# Patient Record
Sex: Female | Born: 1990 | Hispanic: No | Marital: Married | Smoking: Never smoker
Health system: Southern US, Community
[De-identification: ages and names within clinical notes are randomized; demographics above are authoritative.]

## PROBLEM LIST (undated history)

## (undated) DIAGNOSIS — Z789 Other specified health status: Secondary | ICD-10-CM

## (undated) DIAGNOSIS — D649 Anemia, unspecified: Secondary | ICD-10-CM

## (undated) HISTORY — DX: Anemia, unspecified: D64.9

## (undated) HISTORY — PX: NO PAST SURGERIES: SHX2092

---

## 2018-11-26 ENCOUNTER — Encounter: Payer: Self-pay | Admitting: Family Medicine

## 2018-11-26 ENCOUNTER — Ambulatory Visit (INDEPENDENT_AMBULATORY_CARE_PROVIDER_SITE_OTHER): Payer: 59 | Admitting: Family Medicine

## 2018-11-26 ENCOUNTER — Other Ambulatory Visit: Payer: Self-pay

## 2018-11-26 DIAGNOSIS — Z113 Encounter for screening for infections with a predominantly sexual mode of transmission: Secondary | ICD-10-CM | POA: Diagnosis not present

## 2018-11-26 DIAGNOSIS — Z23 Encounter for immunization: Secondary | ICD-10-CM

## 2018-11-26 DIAGNOSIS — Z3401 Encounter for supervision of normal first pregnancy, first trimester: Secondary | ICD-10-CM

## 2018-11-26 DIAGNOSIS — Z3400 Encounter for supervision of normal first pregnancy, unspecified trimester: Secondary | ICD-10-CM

## 2018-11-26 DIAGNOSIS — Z3A01 Less than 8 weeks gestation of pregnancy: Secondary | ICD-10-CM

## 2018-11-26 DIAGNOSIS — Z3689 Encounter for other specified antenatal screening: Secondary | ICD-10-CM | POA: Diagnosis not present

## 2018-11-26 NOTE — Progress Notes (Signed)
  Subjective:  Crystal Bradford is a G1P0 [redacted]w[redacted]d being seen today for her first obstetrical visit.  Her obstetrical history is significant for first pregnancy. She and her husband are excited about the pregnancy. Patient does intend to breast feed. Pregnancy history fully reviewed.  Patient reports nausea and vomiting.  BP 136/83   Pulse (!) 106   Ht 5\' 5"  (1.651 m)   Wt 210 lb 1.9 oz (95.3 kg)   LMP 09/18/2018 (Exact Date)   BMI 34.97 kg/m   HISTORY: OB History  Gravida Para Term Preterm AB Living  1            SAB TAB Ectopic Multiple Live Births               # Outcome Date GA Lbr Len/2nd Weight Sex Delivery Anes PTL Lv  1 Current             History reviewed. No pertinent past medical history.  History reviewed. No pertinent surgical history.  Family History  Problem Relation Age of Onset  . Hypertension Mother   . Diabetes Mother   . Arthritis Mother      Exam  BP 136/83   Pulse (!) 106   Ht 5\' 5"  (1.651 m)   Wt 210 lb 1.9 oz (95.3 kg)   LMP 09/18/2018 (Exact Date)   BMI 34.97 kg/m   CONSTITUTIONAL: Well-developed, well-nourished female in no acute distress.  HENT:  Normocephalic, atraumatic, External right and left ear normal. Oropharynx is clear and moist EYES: Conjunctivae and EOM are normal. Pupils are equal, round, and reactive to light. No scleral icterus.  NECK: Normal range of motion, supple, no masses.  Normal thyroid.  CARDIOVASCULAR: Normal heart rate noted, regular rhythm RESPIRATORY: Clear to auscultation bilaterally. Effort and breath sounds normal, no problems with respiration noted. BREASTS: Symmetric in size. No masses, skin changes, nipple drainage, or lymphadenopathy. ABDOMEN: Soft, normal bowel sounds, no distention noted.  No tenderness, rebound or guarding.  PELVIC: Normal appearing external genitalia; normal appearing vaginal mucosa and cervix. No abnormal discharge noted. Normal uterine size, no other palpable masses, no uterine or adnexal  tenderness. MUSCULOSKELETAL: Normal range of motion. No tenderness.  No cyanosis, clubbing, or edema.  2+ distal pulses. SKIN: Skin is warm and dry. No rash noted. Not diaphoretic. No erythema. No pallor. NEUROLOGIC: Alert and oriented to person, place, and time. Normal reflexes, muscle tone coordination. No cranial nerve deficit noted. PSYCHIATRIC: Normal mood and affect. Normal behavior. Normal judgment and thought content.    Assessment:    Pregnancy: G1P0 Patient Active Problem List   Diagnosis Date Noted  . Supervision of normal first pregnancy, antepartum 11/26/2018      Plan:   1. Supervision of normal first pregnancy, antepartum FHT normal. Discussed panorama - would like at next appt. - Hemoglobinopathy Evaluation - Obstetric Panel, Including HIV - Culture, OB Urine - Baby[employer] APP only - SMN1 COPY NUMBER ANALYSIS (SMA Carrier Screen) - GC/Chlamydia probe amp ([place])not at [facility] - HgB A1c - Flu Vaccine QUAD 36+ mos IM (Fluarix, Quad PF)     Problem list reviewed and updated. 75% of 30 min visit spent on counseling and coordination of care.     Crystal Bradford 11/26/2018

## 2018-11-26 NOTE — Progress Notes (Signed)
DATING AND VIABILITY SONOGRAM   Crystal Bradford is a 29 y.o. year old G1P0 with LMP Patient's last menstrual period was 09/18/2018 (exact date). which would correlate to  [redacted]w[redacted]d weeks gestation.  She has regular menstrual cycles.   She is here today for a confirmatory initial sonogram.    GESTATION: SINGLETON     FETAL ACTIVITY:          Heart rate        164          The fetus is active.    GESTATIONAL AGE AND  BIOMETRICS:  Gestational criteria: Estimated Date of Delivery: 06/25/19 by LMP now at [redacted]w[redacted]d  Previous Scans:0  GESTATIONAL SAC           4.20 cm       9-4 weeks  CROWN RUMP LENGTH           2.79 cm         9-4 weeks                                                                               AVERAGE EGA(BY THIS SCAN):  9-4 weeks  WORKING EDD( LMP ):  06/25/19     TECHNICIAN COMMENTS: Patient informed that the ultrasound is considered a limited obstetric ultrasound and is not intended to be a complete ultrasound exam. Patient also informed that the ultrasound is not being completed with the intent of assessing for fetal or placental anomalies or any pelvic abnormalities. Explained that the purpose of today's ultrasound is to assess for fetal heart rate. Patient acknowledges the purpose of the exam and the limitations of the study.    Kathrene Alu 11/26/2018 8:49 AM

## 2018-11-27 LAB — GC/CHLAMYDIA PROBE AMP (~~LOC~~) NOT AT ARMC
Chlamydia: NEGATIVE
Comment: NEGATIVE
Comment: NORMAL
Neisseria Gonorrhea: NEGATIVE

## 2018-11-28 LAB — CULTURE, OB URINE

## 2018-11-28 LAB — URINE CULTURE, OB REFLEX

## 2018-12-07 LAB — HEMOGLOBINOPATHY EVALUATION
Ferritin: 80 ng/mL (ref 15–150)
Hgb A2 Quant: 2 % (ref 1.8–3.2)
Hgb A: 98 % (ref 96.4–98.8)
Hgb C: 0 %
Hgb F Quant: 0 % (ref 0.0–2.0)
Hgb S: 0 %
Hgb Solubility: NEGATIVE
Hgb Variant: 0 %

## 2018-12-07 LAB — SMN1 COPY NUMBER ANALYSIS (SMA CARRIER SCREENING)

## 2018-12-07 LAB — OBSTETRIC PANEL, INCLUDING HIV
Antibody Screen: NEGATIVE
Basophils Absolute: 0 10*3/uL (ref 0.0–0.2)
Basos: 0 %
EOS (ABSOLUTE): 0.1 10*3/uL (ref 0.0–0.4)
Eos: 1 %
HIV Screen 4th Generation wRfx: NONREACTIVE
Hematocrit: 39.7 % (ref 34.0–46.6)
Hemoglobin: 13.1 g/dL (ref 11.1–15.9)
Hepatitis B Surface Ag: NEGATIVE
Immature Grans (Abs): 0 10*3/uL (ref 0.0–0.1)
Immature Granulocytes: 0 %
Lymphocytes Absolute: 1.9 10*3/uL (ref 0.7–3.1)
Lymphs: 24 %
MCH: 27.7 pg (ref 26.6–33.0)
MCHC: 33 g/dL (ref 31.5–35.7)
MCV: 84 fL (ref 79–97)
Monocytes Absolute: 0.5 10*3/uL (ref 0.1–0.9)
Monocytes: 6 %
Neutrophils Absolute: 5.6 10*3/uL (ref 1.4–7.0)
Neutrophils: 69 %
Platelets: 313 10*3/uL (ref 150–450)
RBC: 4.73 x10E6/uL (ref 3.77–5.28)
RDW: 14.2 % (ref 11.7–15.4)
RPR Ser Ql: NONREACTIVE
Rh Factor: POSITIVE
Rubella Antibodies, IGG: 5.36 index (ref >0.99)
WBC: 8.1 10*3/uL (ref 3.4–10.8)

## 2018-12-07 LAB — HEMOGLOBIN A1C
Est. average glucose Bld gHb Est-mCnc: 103 mg/dL
Hgb A1c MFr Bld: 5.2 % (ref 4.8–5.6)

## 2018-12-25 ENCOUNTER — Other Ambulatory Visit: Payer: Self-pay

## 2018-12-25 ENCOUNTER — Ambulatory Visit (INDEPENDENT_AMBULATORY_CARE_PROVIDER_SITE_OTHER): Payer: 59 | Admitting: Obstetrics & Gynecology

## 2018-12-25 VITALS — BP 123/72 | HR 99 | Wt 211.0 lb

## 2018-12-25 DIAGNOSIS — Z3402 Encounter for supervision of normal first pregnancy, second trimester: Secondary | ICD-10-CM

## 2018-12-25 DIAGNOSIS — Z3400 Encounter for supervision of normal first pregnancy, unspecified trimester: Secondary | ICD-10-CM

## 2018-12-25 DIAGNOSIS — Z3A14 14 weeks gestation of pregnancy: Secondary | ICD-10-CM

## 2018-12-25 NOTE — Progress Notes (Signed)
   PRENATAL VISIT NOTE  Subjective:  Crystal Bradford is a 28 y.o. G1P0 at [redacted]w[redacted]d being seen today for ongoing prenatal care.  She is currently monitored for the following issues for this low-risk pregnancy and has Supervision of normal first pregnancy, antepartum on their problem list.  Patient reports no complaints.  Contractions: Not present. Vag. Bleeding: None.  Movement: Absent. Denies leaking of fluid.   The following portions of the patient's history were reviewed and updated as appropriate: allergies, current medications, past family history, past medical history, past social history, past surgical history and problem list.   Objective:   Vitals:   12/25/18 0809  BP: 123/72  Pulse: 99  Weight: 211 lb 0.6 oz (95.7 kg)    Fetal Status:     Movement: Absent     General:  Alert, oriented and cooperative. Patient is in no acute distress.  Skin: Skin is warm and dry. No rash noted.   Cardiovascular: Normal heart rate noted  Respiratory: Normal respiratory effort, no problems with respiration noted  Abdomen: Soft, gravid, appropriate for gestational age.  Pain/Pressure: Present     Pelvic: Cervical exam deferred        Extremities: Normal range of motion.  Edema: Trace  Mental Status: Normal mood and affect. Normal behavior. Normal judgment and thought content.   Assessment and Plan:  Pregnancy: G1P0 at [redacted]w[redacted]d 1. Supervision of normal first pregnancy, antepartum No FM to date as expected  N/V has resolved.   NIPS today F/u in 2 weeks for AFP Anatomy scan in 4-6 weeks   Preterm labor symptoms and general obstetric precautions including but not limited to vaginal bleeding, contractions, leaking of fluid and fetal movement were reviewed in detail with the patient. Please refer to After Visit Summary for other counseling recommendations.   Return in about 4 weeks (around 01/22/2019) for Web based.  No future appointments.  Lavonia Drafts, MD

## 2018-12-25 NOTE — Addendum Note (Signed)
Addended by: Phill Myron on: 12/25/2018 08:40 AM   Modules accepted: Orders

## 2018-12-25 NOTE — Patient Instructions (Signed)

## 2019-01-13 DIAGNOSIS — Z3400 Encounter for supervision of normal first pregnancy, unspecified trimester: Secondary | ICD-10-CM

## 2019-01-14 ENCOUNTER — Other Ambulatory Visit: Payer: Self-pay

## 2019-01-14 ENCOUNTER — Other Ambulatory Visit: Payer: 59

## 2019-01-14 DIAGNOSIS — Z3400 Encounter for supervision of normal first pregnancy, unspecified trimester: Secondary | ICD-10-CM

## 2019-01-14 NOTE — Progress Notes (Signed)
Patient sent to lab for AFP.  Patient given panorama results. Kathrene Alu RN

## 2019-01-15 NOTE — L&D Delivery Note (Addendum)
OB/GYN Faculty Practice Delivery Note  Crystal Bradford is a 29 y.o. G1P0 s/p SVD at [redacted]w[redacted]d. She was admitted for SOL/SROM.    ROM: 20h 39m with clear fluid GBS Status: Negative Maximum Maternal Temperature: 99.93F  Labor Progress:  Presented after SROM at home and cervical exam 4.5/60%/-2.  Received cytotec.  Pitocin was started at 0300 on 06/18/2019.  She received a epidural.  Remainder of labor unremarkable and progressed to complete.    Delivery Date/Time: 06/18/2019 at 1222 Delivery: Called to room and patient was complete and pushing. Head delivered LOA. No nuchal cord present. Shoulder and body delivered in usual fashion. Infant with spontaneous cry, placed on mother's abdomen, dried and stimulated. Cord clamped x 2 after 1-minute delay, and cut by FOB under my direct supervision. Cord blood drawn. Placenta delivered spontaneously with gentle cord traction. Fundus firm with massage and Pitocin. Labia, perineum, vagina, and cervix were inspected, found to have a hemostatic 1st degree perineal laceration.   Placenta: Intact, 3 vessel cord Complications: None Lacerations: Hemostatic 1st degree perineal EBL: 350cc Analgesia: None   Infant: Viable female   APGARs 8/9   per nursing documentation   EMILY Genene Churn, MD PGY-2 Resident, Family Medicine 06/18/2019, 12:53 PM  OB FELLOW DELIVERY ATTESTATION  I was gloved and present for the delivery in its entirety, and I agree with the above resident's note.    Jerilynn Birkenhead, MD [facility] Family Medicine Fellow, [facility] for [employer], [facility]

## 2019-01-17 LAB — AFP TETRA
DIA Mom Value: 1.65
DIA Value (EIA): 219.89 pg/mL
DSR (By Age)    1 IN: 842
DSR (Second Trimester) 1 IN: 543
Gestational Age: 16.6 WEEKS
MSAFP Mom: 0.67
MSAFP: 21.5 ng/mL
MSHCG Mom: 1.19
MSHCG: 34088 m[IU]/mL
Maternal Age At EDD: 28.2 yr
Osb Risk: 10000
T18 (By Age): 1:3281 {titer}
Test Results:: NEGATIVE
Weight: 211 [lb_av]
uE3 Mom: 1.14
uE3 Value: 1.14 ng/mL

## 2019-01-22 ENCOUNTER — Other Ambulatory Visit: Payer: Self-pay

## 2019-01-22 ENCOUNTER — Ambulatory Visit (INDEPENDENT_AMBULATORY_CARE_PROVIDER_SITE_OTHER): Payer: 59 | Admitting: Family Medicine

## 2019-01-22 VITALS — BP 120/61 | HR 108 | Wt 213.0 lb

## 2019-01-22 DIAGNOSIS — Z3402 Encounter for supervision of normal first pregnancy, second trimester: Secondary | ICD-10-CM

## 2019-01-22 DIAGNOSIS — Z3400 Encounter for supervision of normal first pregnancy, unspecified trimester: Secondary | ICD-10-CM

## 2019-01-22 DIAGNOSIS — Z3A18 18 weeks gestation of pregnancy: Secondary | ICD-10-CM

## 2019-01-22 NOTE — Progress Notes (Signed)
   PRENATAL VISIT NOTE  Subjective:  Crystal Bradford is a 29 y.o. G1P0 at [redacted]w[redacted]d being seen today for ongoing prenatal care.  She is currently monitored for the following issues for this low-risk pregnancy and has Supervision of normal first pregnancy, antepartum on their problem list.  Patient reports no complaints.  Contractions: Not present. Vag. Bleeding: None.  Movement: Present. Denies leaking of fluid.   The following portions of the patient's history were reviewed and updated as appropriate: allergies, current medications, past family history, past medical history, past social history, past surgical history and problem list.   Objective:   Vitals:   01/22/19 0923  BP: 120/61  Pulse: (!) 108  Weight: 213 lb (96.6 kg)    Fetal Status: Fetal Heart Rate (bpm): 159   Movement: Present     General:  Alert, oriented and cooperative. Patient is in no acute distress.  Skin: Skin is warm and dry. No rash noted.   Cardiovascular: Normal heart rate noted  Respiratory: Normal respiratory effort, no problems with respiration noted  Abdomen: Soft, gravid, appropriate for gestational age.  Pain/Pressure: Present     Pelvic: Cervical exam deferred        Extremities: Normal range of motion.  Edema: None  Mental Status: Normal mood and affect. Normal behavior. Normal judgment and thought content.   Assessment and Plan:  Pregnancy: G1P0 at [redacted]w[redacted]d  1. Supervision of normal first pregnancy, antepartum NIPS normal  Preterm labor symptoms and general obstetric precautions including but not limited to vaginal bleeding, contractions, leaking of fluid and fetal movement were reviewed in detail with the patient. Please refer to After Visit Summary for other counseling recommendations.   Return in about 4 weeks (around 02/19/2019) for OB f/u.  Future Appointments  Date Time Provider Department Center  01/29/2019  9:00 AM [facility] [place] 3 WH-[facility]US    Levie Heritage, DO

## 2019-01-29 ENCOUNTER — Other Ambulatory Visit: Payer: Self-pay

## 2019-01-29 ENCOUNTER — Ambulatory Visit (HOSPITAL_COMMUNITY)
Admission: RE | Admit: 2019-01-29 | Discharge: 2019-01-29 | Disposition: A | Discharge status: Home / Self Care | Payer: 59 | Source: Ambulatory Visit | Attending: Obstetrics and Gynecology | Admitting: Obstetrics and Gynecology

## 2019-01-29 ENCOUNTER — Other Ambulatory Visit (HOSPITAL_COMMUNITY): Payer: Self-pay | Admitting: *Deleted

## 2019-01-29 DIAGNOSIS — O99212 Obesity complicating pregnancy, second trimester: Secondary | ICD-10-CM

## 2019-01-29 DIAGNOSIS — Z3400 Encounter for supervision of normal first pregnancy, unspecified trimester: Secondary | ICD-10-CM

## 2019-01-29 DIAGNOSIS — Z3A19 19 weeks gestation of pregnancy: Secondary | ICD-10-CM

## 2019-01-29 DIAGNOSIS — Z362 Encounter for other antenatal screening follow-up: Secondary | ICD-10-CM

## 2019-02-02 ENCOUNTER — Telehealth: Payer: Self-pay

## 2019-02-02 NOTE — Telephone Encounter (Signed)
Pt called the office concerned about [place] results. I explained to pt that a f/u US has been scheduled because the [place] tech was unable to see everything because of the position that baby was in. Pt also requests Rx for nausea medication. Advised pt to try B6 100 mg 3x daily and Unisom once daily. Understanding was voiced.  Tasman Zapata l [place], CMA

## 2019-02-18 ENCOUNTER — Encounter: Payer: Self-pay | Admitting: Family Medicine

## 2019-02-18 ENCOUNTER — Other Ambulatory Visit: Payer: Self-pay

## 2019-02-18 ENCOUNTER — Ambulatory Visit (INDEPENDENT_AMBULATORY_CARE_PROVIDER_SITE_OTHER): Payer: 59 | Admitting: Family Medicine

## 2019-02-18 VITALS — BP 121/63 | HR 115 | Wt 218.0 lb

## 2019-02-18 DIAGNOSIS — Z3400 Encounter for supervision of normal first pregnancy, unspecified trimester: Secondary | ICD-10-CM

## 2019-02-18 DIAGNOSIS — Z3A21 21 weeks gestation of pregnancy: Secondary | ICD-10-CM

## 2019-02-18 DIAGNOSIS — Z3402 Encounter for supervision of normal first pregnancy, second trimester: Secondary | ICD-10-CM

## 2019-02-18 NOTE — Progress Notes (Signed)
   PRENATAL VISIT NOTE  Subjective:  Crystal Bradford is a 29 y.o. G1P0 at [redacted]w[redacted]d being seen today for ongoing prenatal care.  She is currently monitored for the following issues for this low-risk pregnancy and has Supervision of normal first pregnancy, antepartum on their problem list.  Patient reports some lower abd pain after walking.  Contractions: Not present. Vag. Bleeding: None.  Movement: Present. Denies leaking of fluid.   The following portions of the patient's history were reviewed and updated as appropriate: allergies, current medications, past family history, past medical history, past social history, past surgical history and problem list.   Objective:   Vitals:   02/18/19 0927  BP: 121/63  Pulse: (!) 115  Weight: 218 lb (98.9 kg)    Fetal Status: Fetal Heart Rate (bpm): 164 Fundal Height: 20 cm Movement: Present     General:  Alert, oriented and cooperative. Patient is in no acute distress.  Skin: Skin is warm and dry. No rash noted.   Cardiovascular: Normal heart rate noted  Respiratory: Normal respiratory effort, no problems with respiration noted  Abdomen: Soft, gravid, appropriate for gestational age.  Pain/Pressure: Present     Pelvic: Cervical exam deferred        Extremities: Normal range of motion.  Edema: None  Mental Status: Normal mood and affect. Normal behavior. Normal judgment and thought content.   Assessment and Plan:  Pregnancy: G1P0 at [redacted]w[redacted]d 1. Supervision of normal first pregnancy, antepartum FHT and FH normal. [place] reviewed. Has f/u US in a couple of weeks.   Preterm labor symptoms and general obstetric precautions including but not limited to vaginal bleeding, contractions, leaking of fluid and fetal movement were reviewed in detail with the patient. Please refer to After Visit Summary for other counseling recommendations.   Return in about 4 weeks (around 03/18/2019) for HR OB f/u, 2 hr GTT, In Office.  Future Appointments  Date Time Provider  Department Center  02/26/2019  8:30 AM [facility] [place] 5 WH-MFCUS MFC-US  03/18/2019  8:30 AM Levie Heritage, DO [facility] None    Levie Heritage, DO

## 2019-02-26 ENCOUNTER — Other Ambulatory Visit: Payer: Self-pay

## 2019-02-26 ENCOUNTER — Ambulatory Visit (HOSPITAL_COMMUNITY)
Admission: RE | Admit: 2019-02-26 | Discharge: 2019-02-26 | Disposition: A | Discharge status: Home / Self Care | Payer: 59 | Source: Ambulatory Visit | Attending: Obstetrics and Gynecology | Admitting: Obstetrics and Gynecology

## 2019-02-26 DIAGNOSIS — O99212 Obesity complicating pregnancy, second trimester: Secondary | ICD-10-CM | POA: Diagnosis not present

## 2019-02-26 DIAGNOSIS — Z3A23 23 weeks gestation of pregnancy: Secondary | ICD-10-CM

## 2019-02-26 DIAGNOSIS — Z362 Encounter for other antenatal screening follow-up: Secondary | ICD-10-CM

## 2019-03-12 ENCOUNTER — Encounter (HOSPITAL_COMMUNITY): Payer: Self-pay | Admitting: Obstetrics and Gynecology

## 2019-03-12 ENCOUNTER — Inpatient Hospital Stay (HOSPITAL_COMMUNITY)
Admission: AD | Admit: 2019-03-12 | Discharge: 2019-03-12 | Disposition: A | Discharge status: Home / Self Care | Payer: 59 | Attending: Obstetrics and Gynecology | Admitting: Obstetrics and Gynecology

## 2019-03-12 ENCOUNTER — Other Ambulatory Visit: Payer: Self-pay

## 2019-03-12 DIAGNOSIS — O368120 Decreased fetal movements, second trimester, not applicable or unspecified: Secondary | ICD-10-CM | POA: Insufficient documentation

## 2019-03-12 DIAGNOSIS — Z880 Allergy status to penicillin: Secondary | ICD-10-CM | POA: Diagnosis not present

## 2019-03-12 DIAGNOSIS — R030 Elevated blood-pressure reading, without diagnosis of hypertension: Secondary | ICD-10-CM | POA: Diagnosis not present

## 2019-03-12 DIAGNOSIS — Z3400 Encounter for supervision of normal first pregnancy, unspecified trimester: Secondary | ICD-10-CM

## 2019-03-12 DIAGNOSIS — O26892 Other specified pregnancy related conditions, second trimester: Secondary | ICD-10-CM | POA: Insufficient documentation

## 2019-03-12 DIAGNOSIS — Z3492 Encounter for supervision of normal pregnancy, unspecified, second trimester: Secondary | ICD-10-CM

## 2019-03-12 DIAGNOSIS — O368121 Decreased fetal movements, second trimester, fetus 1: Secondary | ICD-10-CM

## 2019-03-12 DIAGNOSIS — Z3A25 25 weeks gestation of pregnancy: Secondary | ICD-10-CM

## 2019-03-12 HISTORY — DX: Other specified health status: Z78.9

## 2019-03-12 NOTE — MAU Note (Signed)
Hasn't felt the baby ,move since like 4 this morning.  Usually feels it around 1000 and again around 1400, hasn't felt it and that is just not normal. Denies pain.

## 2019-03-12 NOTE — MAU Provider Note (Addendum)
History     CSN: [id number]  Arrival date and time: 03/12/19 1625   First Provider Initiated Contact with Patient 03/12/19 1709      Chief Complaint  Patient presents with  . Decreased Fetal Movement   HPI Crystal Bradford is a 29 y.o. G1P0 at [redacted]w[redacted]d who presents to MAU with chief complaint of decreased fetal movement. This is a new problem, onset today at 0400. Patient states she responded to the decreased fetal movement by eating, drinking a sugary drink and moving around but was unable to facilitate fetal movement. She became even more concern when she was unable to detect fetal heart tones with her home Doppler.   She denies pain, vaginal bleeding, leaking of fluid, fever, falls, or recent illness.   OB History    Gravida  1   Para      Term      Preterm      AB      Living        SAB      TAB      Ectopic      Multiple      Live Births              Past Medical History:  Diagnosis Date  . Medical history non-contributory     Past Surgical History:  Procedure Laterality Date  . NO PAST SURGERIES      Family History  Problem Relation Age of Onset  . Hypertension Mother   . Diabetes Mother   . Arthritis Mother     Social History   Tobacco Use  . Smoking status: Never Smoker  . Smokeless tobacco: Never Used  Substance Use Topics  . Alcohol use: Never  . Drug use: Never    Allergies:  Allergies  Allergen Reactions  . Penicillin G Rash  . Amoxicillin Hives and Swelling    No medications prior to admission.    Review of Systems  Constitutional: Negative for chills, fatigue and fever.  Eyes: Negative for photophobia and visual disturbance.  Gastrointestinal: Negative for abdominal pain.  Genitourinary: Negative for vaginal bleeding, vaginal discharge and vaginal pain.  Musculoskeletal: Negative for back pain.  Neurological: Negative for headaches.  All other systems reviewed and are negative.  Physical Exam   Blood pressure  135/72, pulse (!) 101, temperature 99.6 F (37.6 C), temperature source Oral, resp. rate 18, weight 105.7 kg, last menstrual period 09/18/2018, SpO2 100 %.  Physical Exam  Nursing note and vitals reviewed. Constitutional: She is oriented to person, place, and time. She appears well-developed and well-nourished.  Cardiovascular: Normal rate.  Respiratory: Effort normal and breath sounds normal.  GI: She exhibits no distension. There is no abdominal tenderness. There is no rebound, no guarding and no CVA tenderness.  Gravid  Neurological: She is alert and oriented to person, place, and time.  Skin: Skin is warm and dry.  Psychiatric: She has a normal mood and affect. Her behavior is normal. Judgment and thought content normal.    MAU Course/MDM  Procedures  --Reassuring fetal surveillance, elements of tracing reviewed with patient. Baseline 140, mod var, 15 x 15 accels, no decels --Patient given NST button. Pushed button 14 times within twelve minutes --Elevated initial blood pressure when anxious about fetal status, then normotensive once reassurance provided.    No severe range or severe symptoms --Reviewed typical initiation of kick counts at 28-30 weeks.   Patient Vitals for the past 24 hrs:  BP Temp Temp  src Pulse Resp SpO2 Weight  03/12/19 1747 135/72 -- -- -- -- -- --  03/12/19 1730 123/63 -- -- (!) 101 -- -- --  03/12/19 1705 132/77 -- -- (!) 116 -- -- --  03/12/19 1639 (!) 142/66 99.6 F (37.6 C) Oral (!) 122 18 100 % 105.7 kg    Assessment and Plan  --29 y.o. G1P0 at [redacted]w[redacted]d  --Reactive tracing --Active fetal movement detectable by patient --Elevated BP x 1 --Discharge home in stable condition  F/U: --[facility] 03/18/2019  Calvert Cantor, CNM 03/12/2019, 6:35 PM

## 2019-03-12 NOTE — Discharge Instructions (Signed)
Fetal Movement Counts Patient Name: ________________________________________________ Patient Due Date: ____________________ What is a fetal movement count?  A fetal movement count is the number of times that you feel your baby move during a certain amount of time. This may also be called a fetal kick count. A fetal movement count is recommended for every pregnant woman. You may be asked to start counting fetal movements as early as week 28 of your pregnancy. Pay attention to when your baby is most active. You may notice your baby's sleep and wake cycles. You may also notice things that make your baby move more. You should do a fetal movement count:  When your baby is normally most active.  At the same time each day. A good time to count movements is while you are resting, after having something to eat and drink. How do I count fetal movements? 1. Find a quiet, comfortable area. Sit, or lie down on your side. 2. Write down the date, the start time and stop time, and the number of movements that you felt between those two times. Take this information with you to your health care visits. 3. Write down your start time when you feel the first movement. 4. Count kicks, flutters, swishes, rolls, and jabs. You should feel at least 10 movements. 5. You may stop counting after you have felt 10 movements, or if you have been counting for 2 hours. Write down the stop time. 6. If you do not feel 10 movements in 2 hours, contact your health care provider for further instructions. Your health care provider may want to do additional tests to assess your baby's well-being. Contact a health care provider if:  You feel fewer than 10 movements in 2 hours.  Your baby is not moving like he or she usually does. Date: ____________ Start time: ____________ Stop time: ____________ Movements: ____________ Date: ____________ Start time: ____________ Stop time: ____________ Movements: ____________ Date: ____________  Start time: ____________ Stop time: ____________ Movements: ____________ Date: ____________ Start time: ____________ Stop time: ____________ Movements: ____________ Date: ____________ Start time: ____________ Stop time: ____________ Movements: ____________ Date: ____________ Start time: ____________ Stop time: ____________ Movements: ____________ Date: ____________ Start time: ____________ Stop time: ____________ Movements: ____________ Date: ____________ Start time: ____________ Stop time: ____________ Movements: ____________ Date: ____________ Start time: ____________ Stop time: ____________ Movements: ____________ This information is not intended to replace advice given to you by your health care provider. Make sure you discuss any questions you have with your health care provider. Document Revised: 08/20/2018 Document Reviewed: 08/20/2018 Elsevier Patient Education  2020 [employer].  

## 2019-03-18 ENCOUNTER — Other Ambulatory Visit: Payer: Self-pay

## 2019-03-18 ENCOUNTER — Ambulatory Visit (INDEPENDENT_AMBULATORY_CARE_PROVIDER_SITE_OTHER): Payer: 59 | Admitting: Family Medicine

## 2019-03-18 ENCOUNTER — Encounter: Payer: Self-pay | Admitting: Family Medicine

## 2019-03-18 VITALS — BP 130/61 | HR 95 | Wt 222.0 lb

## 2019-03-18 DIAGNOSIS — Z23 Encounter for immunization: Secondary | ICD-10-CM

## 2019-03-18 DIAGNOSIS — Z3400 Encounter for supervision of normal first pregnancy, unspecified trimester: Secondary | ICD-10-CM

## 2019-03-18 NOTE — Progress Notes (Signed)
Patient complaining of constipation. Armandina Stammer RN

## 2019-03-18 NOTE — Progress Notes (Signed)
   PRENATAL VISIT NOTE  Subjective:  Crystal Bradford is a 29 y.o. G1P0 at [redacted]w[redacted]d being seen today for ongoing prenatal care.  She is currently monitored for the following issues for this low-risk pregnancy and has Supervision of normal first pregnancy, antepartum on their problem list.  Patient reports mild hemorrhoids, constipation.  Contractions: Not present. Vag. Bleeding: None.  Movement: Present. Denies leaking of fluid.   The following portions of the patient's history were reviewed and updated as appropriate: allergies, current medications, past family history, past medical history, past social history, past surgical history and problem list.   Objective:   Vitals:   03/18/19 0826  BP: 130/61  Pulse: 95  Weight: 222 lb (100.7 kg)    Fetal Status: Fetal Heart Rate (bpm): 145   Movement: Present     General:  Alert, oriented and cooperative. Patient is in no acute distress.  Skin: Skin is warm and dry. No rash noted.   Cardiovascular: Normal heart rate noted  Respiratory: Normal respiratory effort, no problems with respiration noted  Abdomen: Soft, gravid, appropriate for gestational age.  Pain/Pressure: Present     Pelvic: Cervical exam deferred        Extremities: Normal range of motion.  Edema: None  Mental Status: Normal mood and affect. Normal behavior. Normal judgment and thought content.   Assessment and Plan:  Pregnancy: G1P0 at [redacted]w[redacted]d 1. Supervision of normal first pregnancy, antepartum FHT and FH normal. Miralax prn constipation, Prep H for hemorrhoids. - Glucose Tolerance, 2 Hours w/1 Hour - RPR - CBC - HIV antibody (with reflex)  Preterm labor symptoms and general obstetric precautions including but not limited to vaginal bleeding, contractions, leaking of fluid and fetal movement were reviewed in detail with the patient. Please refer to After Visit Summary for other counseling recommendations.   Return in about 4 weeks (around 04/15/2019).  Future Appointments   Date Time Provider Department Center  04/15/2019 10:30 AM Levie Heritage, DO [facility] None    Levie Heritage, DO

## 2019-03-19 LAB — GLUCOSE TOLERANCE, 2 HOURS W/ 1HR
Glucose, 1 hour: 154 mg/dL (ref 65–179)
Glucose, 2 hour: 98 mg/dL (ref 65–152)
Glucose, Fasting: 84 mg/dL (ref 65–91)

## 2019-03-19 LAB — CBC
Hematocrit: 36.5 % (ref 34.0–46.6)
Hemoglobin: 12.2 g/dL (ref 11.1–15.9)
MCH: 28.9 pg (ref 26.6–33.0)
MCHC: 33.4 g/dL (ref 31.5–35.7)
MCV: 87 fL (ref 79–97)
Platelets: 288 10*3/uL (ref 150–450)
RBC: 4.22 x10E6/uL (ref 3.77–5.28)
RDW: 13.7 % (ref 11.7–15.4)
WBC: 10.9 10*3/uL — ABNORMAL HIGH (ref 3.4–10.8)

## 2019-03-19 LAB — HIV ANTIBODY (ROUTINE TESTING W REFLEX): HIV Screen 4th Generation wRfx: NONREACTIVE

## 2019-03-19 LAB — RPR: RPR Ser Ql: NONREACTIVE

## 2019-04-05 ENCOUNTER — Telehealth: Payer: Self-pay

## 2019-04-05 NOTE — Telephone Encounter (Signed)
Patient called over the weekend and states that her husband and parent all tested positive for covid. Patient states she tested negative, but has begun to have symptoms today. Patient instructed to try and go to [email or web address] and make an appointment for testing tomorrow or Wednesday. Patient is pregnant and made aware to push fluid (water/gartorade) and can take tylenol for headaches. Patient can sleep with a few pillows behind her head if congestion becomes worse. Patient states understanding and made her aware to seek care at Emergency room if she has any trouble breathing. Patient understands. Armandina Stammer RN

## 2019-04-07 ENCOUNTER — Ambulatory Visit: Payer: 59 | Attending: Internal Medicine

## 2019-04-07 ENCOUNTER — Other Ambulatory Visit: Payer: 59

## 2019-04-07 DIAGNOSIS — Z20822 Contact with and (suspected) exposure to covid-19: Secondary | ICD-10-CM

## 2019-04-08 LAB — NOVEL CORONAVIRUS, NAA: SARS-CoV-2, NAA: DETECTED — AB

## 2019-04-08 LAB — SARS-COV-2, NAA 2 DAY TAT

## 2019-04-08 NOTE — Telephone Encounter (Signed)
Reviewed positive Covid 19 results with the patient. Discussed while staying home and away from others over the next 10 days to drink plenty of fluids and rest. Ensure disinfecting of all common household areas often. Wear a mask and keep a distance from others in your home if you have to leave your area. Tylenol for aches. Monitor your breathing and with any concerns/changes, seek treatment immediatly at the ED with a mask on. [facility] Department notified.

## 2019-04-15 ENCOUNTER — Telehealth (INDEPENDENT_AMBULATORY_CARE_PROVIDER_SITE_OTHER): Payer: 59 | Admitting: Family Medicine

## 2019-04-15 DIAGNOSIS — O98513 Other viral diseases complicating pregnancy, third trimester: Secondary | ICD-10-CM | POA: Insufficient documentation

## 2019-04-15 DIAGNOSIS — Z3A29 29 weeks gestation of pregnancy: Secondary | ICD-10-CM

## 2019-04-15 DIAGNOSIS — Z3400 Encounter for supervision of normal first pregnancy, unspecified trimester: Secondary | ICD-10-CM

## 2019-04-15 DIAGNOSIS — U071 COVID-19: Secondary | ICD-10-CM | POA: Insufficient documentation

## 2019-04-15 NOTE — Progress Notes (Signed)
   OBSTETRICS PRENATAL VIRTUAL VISIT ENCOUNTER NOTE  Provider location: Center for [facility] at [facility]   I connected with Crystal Bradford on 04/15/19 at 10:30 AM EDT by MyChart Video Encounter at home and verified that I am speaking with the correct person using two identifiers.   I discussed the limitations, risks, security and privacy concerns of performing an evaluation and management service virtually and the availability of in person appointments. I also discussed with the patient that there may be a patient responsible charge related to this service. The patient expressed understanding and agreed to proceed. Subjective:  Crystal Bradford is a 29 y.o. G1P0 at [redacted]w[redacted]d being seen today for ongoing prenatal care.  She is currently monitored for the following issues for this low-risk pregnancy and has Supervision of normal first pregnancy, antepartum and COVID-19 affecting pregnancy in third trimester on their problem list.  Patient reports tested + for Covid 3/24. Fatigue, fever (once), some SOB. Improving now..   .  .   . Denies any leaking of fluid.   The following portions of the patient's history were reviewed and updated as appropriate: allergies, current medications, past family history, past medical history, past social history, past surgical history and problem list.   Objective:  There were no vitals filed for this visit.  Fetal Status:           General:  Alert, oriented and cooperative. Patient is in no acute distress.  Respiratory: Normal respiratory effort, no problems with respiration noted  Mental Status: Normal mood and affect. Normal behavior. Normal judgment and thought content.  Rest of physical exam deferred due to type of encounter  Imaging: No results found.  Assessment and Plan:  Pregnancy: G1P0 at [redacted]w[redacted]d 1. Supervision of normal first pregnancy, antepartum Good fetal movement  2. COVID-19 affecting pregnancy in third trimester Discussed  possible long term complications. Will watch for symptoms.  Preterm labor symptoms and general obstetric precautions including but not limited to vaginal bleeding, contractions, leaking of fluid and fetal movement were reviewed in detail with the patient. I discussed the assessment and treatment plan with the patient. The patient was provided an opportunity to ask questions and all were answered. The patient agreed with the plan and demonstrated an understanding of the instructions. The patient was advised to call back or seek an in-person office evaluation/go to [facility] at [facility] for any urgent or concerning symptoms. Please refer to After Visit Summary for other counseling recommendations.   I provided 10 minutes of face-to-face time during this encounter.  Return in about 2 weeks (around 04/29/2019) for OB f/u, In Office.  No future appointments.  Levie Heritage, DO Center for [employer], [facility]

## 2019-04-30 ENCOUNTER — Other Ambulatory Visit: Payer: Self-pay

## 2019-04-30 ENCOUNTER — Ambulatory Visit (INDEPENDENT_AMBULATORY_CARE_PROVIDER_SITE_OTHER): Payer: 59 | Admitting: Family Medicine

## 2019-04-30 VITALS — BP 118/63 | HR 109 | Wt 226.0 lb

## 2019-04-30 DIAGNOSIS — Z3400 Encounter for supervision of normal first pregnancy, unspecified trimester: Secondary | ICD-10-CM

## 2019-04-30 DIAGNOSIS — Z3A32 32 weeks gestation of pregnancy: Secondary | ICD-10-CM

## 2019-04-30 DIAGNOSIS — O98513 Other viral diseases complicating pregnancy, third trimester: Secondary | ICD-10-CM

## 2019-04-30 DIAGNOSIS — U071 COVID-19: Secondary | ICD-10-CM

## 2019-04-30 NOTE — Progress Notes (Signed)
   PRENATAL VISIT NOTE  Subjective:  Crystal Bradford is a 29 y.o. G1P0 at [redacted]w[redacted]d being seen today for ongoing prenatal care.  She is currently monitored for the following issues for this low-risk pregnancy and has Supervision of normal first pregnancy, antepartum and COVID-19 affecting pregnancy in third trimester on their problem list.  Patient reports no complaints.  Contractions: Not present. Vag. Bleeding: None.  Movement: Present. Denies leaking of fluid.   The following portions of the patient's history were reviewed and updated as appropriate: allergies, current medications, past family history, past medical history, past social history, past surgical history and problem list.   Objective:   Vitals:   04/30/19 0801  BP: 118/63  Pulse: (!) 109  Weight: 226 lb (102.5 kg)    Fetal Status: Fetal Heart Rate (bpm): 152 Fundal Height: 32 cm Movement: Present  Presentation: Vertex  General:  Alert, oriented and cooperative. Patient is in no acute distress.  Skin: Skin is warm and dry. No rash noted.   Cardiovascular: Normal heart rate noted  Respiratory: Normal respiratory effort, no problems with respiration noted  Abdomen: Soft, gravid, appropriate for gestational age.  Pain/Pressure: Present     Pelvic: Cervical exam deferred        Extremities: Normal range of motion.  Edema: Trace  Mental Status: Normal mood and affect. Normal behavior. Normal judgment and thought content.   Assessment and Plan:  Pregnancy: G1P0 at [redacted]w[redacted]d  1. Supervision of normal first pregnancy, antepartum FHT and FH normal  2. COVID-19 affecting pregnancy in third trimester Having some daily mild headaches (lasting for about an hour or so) and occasional SOB, but no other complications   Preterm labor symptoms and general obstetric precautions including but not limited to vaginal bleeding, contractions, leaking of fluid and fetal movement were reviewed in detail with the patient. Please refer to After  Visit Summary for other counseling recommendations.   Return in about 2 weeks (around 05/14/2019).  No future appointments.  Levie Heritage, DO

## 2019-05-13 ENCOUNTER — Other Ambulatory Visit: Payer: Self-pay

## 2019-05-13 ENCOUNTER — Ambulatory Visit (INDEPENDENT_AMBULATORY_CARE_PROVIDER_SITE_OTHER): Payer: 59 | Admitting: Family Medicine

## 2019-05-13 VITALS — BP 134/72 | HR 105 | Wt 229.0 lb

## 2019-05-13 DIAGNOSIS — Z3400 Encounter for supervision of normal first pregnancy, unspecified trimester: Secondary | ICD-10-CM

## 2019-05-13 NOTE — Progress Notes (Signed)
   PRENATAL VISIT NOTE  Subjective:  Crystal Bradford is a 29 y.o. G1P0 at [redacted]w[redacted]d being seen today for ongoing prenatal care.  She is currently monitored for the following issues for this low-risk pregnancy and has Supervision of normal first pregnancy, antepartum and COVID-19 affecting pregnancy in third trimester on their problem list.  Patient reports left psoas pain.  Contractions: Not present. Vag. Bleeding: None.  Movement: Present. Denies leaking of fluid.   The following portions of the patient's history were reviewed and updated as appropriate: allergies, current medications, past family history, past medical history, past social history, past surgical history and problem list.   Objective:   Vitals:   05/13/19 0853  BP: 134/72  Pulse: (!) 105  Weight: 229 lb (103.9 kg)    Fetal Status: Fetal Heart Rate (bpm): 143   Movement: Present     General:  Alert, oriented and cooperative. Patient is in no acute distress.  Skin: Skin is warm and dry. No rash noted.   Cardiovascular: Normal heart rate noted  Respiratory: Normal respiratory effort, no problems with respiration noted  Abdomen: Soft, gravid, appropriate for gestational age.  Pain/Pressure: Present     Pelvic: Cervical exam deferred        Extremities: Normal range of motion.  Edema: Trace  Mental Status: Normal mood and affect. Normal behavior. Normal judgment and thought content.   Assessment and Plan:  Pregnancy: G1P0 at [redacted]w[redacted]d 1. Supervision of normal first pregnancy, antepartum FHT and FH normal  Preterm labor symptoms and general obstetric precautions including but not limited to vaginal bleeding, contractions, leaking of fluid and fetal movement were reviewed in detail with the patient. Please refer to After Visit Summary for other counseling recommendations.   Return in about 2 weeks (around 05/27/2019) for OB f/u.  Future Appointments  Date Time Provider Department Center  05/28/2019  8:30 AM Willodean Rosenthal, MD [facility] None  06/04/2019  8:15 AM Levie Heritage, DO [facility] None  06/11/2019  8:15 AM Anyanwu, Jethro Bastos, MD [facility] None  06/18/2019  8:15 AM Willodean Rosenthal, MD [facility] None    Levie Heritage, DO

## 2019-05-21 ENCOUNTER — Encounter: Payer: 59 | Admitting: Obstetrics & Gynecology

## 2019-05-28 ENCOUNTER — Other Ambulatory Visit: Payer: Self-pay

## 2019-05-28 ENCOUNTER — Encounter: Payer: Self-pay | Admitting: Obstetrics & Gynecology

## 2019-05-28 ENCOUNTER — Other Ambulatory Visit (HOSPITAL_COMMUNITY)
Admission: RE | Admit: 2019-05-28 | Discharge: 2019-05-28 | Disposition: A | Discharge status: Home / Self Care | Payer: 59 | Source: Ambulatory Visit | Attending: Obstetrics & Gynecology | Admitting: Obstetrics & Gynecology

## 2019-05-28 ENCOUNTER — Ambulatory Visit (INDEPENDENT_AMBULATORY_CARE_PROVIDER_SITE_OTHER): Payer: 59 | Admitting: Obstetrics & Gynecology

## 2019-05-28 VITALS — BP 115/58 | HR 105 | Wt 231.0 lb

## 2019-05-28 DIAGNOSIS — U071 COVID-19: Secondary | ICD-10-CM

## 2019-05-28 DIAGNOSIS — Z3400 Encounter for supervision of normal first pregnancy, unspecified trimester: Secondary | ICD-10-CM | POA: Insufficient documentation

## 2019-05-28 DIAGNOSIS — O98513 Other viral diseases complicating pregnancy, third trimester: Secondary | ICD-10-CM

## 2019-05-28 NOTE — Patient Instructions (Addendum)
Group B Streptococcus Infection During Pregnancy °Group B Streptococcus (GBS) is a type of bacteria that is often found in healthy people. It is commonly found in the rectum, vagina, and intestines. In people who are healthy and not pregnant, the bacteria rarely cause serious illness or complications. However, women who test positive for GBS during pregnancy can pass the bacteria to the baby during childbirth. This can cause serious infection in the baby after birth. °Women with GBS may also have infections during their pregnancy or soon after childbirth. The infections include urinary tract infections (UTIs) or infections of the uterus. GBS also increases a woman's risk of complications during pregnancy, such as early labor or delivery, miscarriage, or stillbirth. Routine testing for GBS is recommended for all pregnant women. °What are the causes? °This condition is caused by bacteria called Streptococcus agalactiae. °What increases the risk? °You may have a higher risk for GBS infection during pregnancy if you had one during a past pregnancy. °What are the signs or symptoms? °In most cases, GBS infection does not cause symptoms in pregnant women. If symptoms exist, they may include: °· Labor that starts before the 37th week of pregnancy. °· A UTI or bladder infection. This may cause a fever, frequent urination, or pain and burning during urination. °· Fever during labor. There can also be a rapid heartbeat in the mother or baby. °Rare but serious symptoms of a GBS infection in women include: °· Blood infection (septicemia). This may cause fever, chills, or confusion. °· Lung infection (pneumonia). This may cause fever, chills, cough, rapid breathing, chest pain, or difficulty breathing. °· Bone, joint, skin, or soft tissue infection. °How is this diagnosed? °You may be screened for GBS between week 35 and week 37 of pregnancy. If you have symptoms of preterm labor, you may be screened earlier. This condition is  diagnosed based on lab test results from: °· A swab of fluid from the vagina and rectum. °· A urine sample. °How is this treated? °This condition is treated with antibiotic medicine. Antibiotic medicine may be given: °· To you when you go into labor, or as soon as your water breaks. The medicines will continue until after you give birth. If you are having a cesarean delivery, you do not need antibiotics unless your water has broken. °· To your baby, if he or she requires treatment. Your health care provider will check your baby to decide if he or she needs antibiotics to prevent a serious infection. °Follow these instructions at home: °· Take over-the-counter and prescription medicines only as told by your health care provider. °· Take your antibiotic medicine as told by your health care provider. Do not stop taking the antibiotic even if you start to feel better. °· Keep all pre-birth (prenatal) visits and follow-up visits as told by your health care provider. This is important. °Contact a health care provider if: °· You have pain or burning when you urinate. °· You have to urinate more often than usual. °· You have a fever or chills. °· You develop a bad-smelling vaginal discharge. °Get help right away if: °· Your water breaks. °· You go into labor. °· You have severe pain in your abdomen. °· You have difficulty breathing. °· You have chest pain. °These symptoms may represent a serious problem that is an emergency. Do not wait to see if the symptoms will go away. Get medical help right away. Call your local emergency services (911 in the [place] Do not drive yourself to   the hospital. Summary  GBS is a type of bacteria that is common in healthy people.  During pregnancy, colonization with GBS can cause serious complications for you or your baby.  Your health care provider will screen you between 35 and 37 weeks of pregnancy to determine if you are colonized with GBS.  If you are colonized with GBS during  pregnancy, your health care provider will recommend antibiotics through an IV during labor.  After delivery, your baby will be evaluated for complications related to potential GBS infection and may require antibiotics to prevent a serious infection. This information is not intended to replace advice given to you by your health care provider. Make sure you discuss any questions you have with your health care provider. Document Revised: 07/27/2018 Document Reviewed: 07/27/2018 Elsevier Patient Education  2020 [employer]. Levonorgestrel intrauterine device (IUD) What is this medicine? LEVONORGESTREL IUD (LEE voe nor jes trel) is a contraceptive (birth control) device. The device is placed inside the uterus by a healthcare professional. It is used to prevent pregnancy. This device can also be used to treat heavy bleeding that occurs during your period. This medicine may be used for other purposes; ask your health care provider or pharmacist if you have questions. COMMON BRAND NAME(S): Cameron Ali What should I tell my health care provider before I take this medicine? They need to know if you have any of these conditions:  abnormal Pap smear  cancer of the breast, uterus, or cervix  diabetes  endometritis  genital or pelvic infection now or in the past  have more than one sexual partner or your partner has more than one partner  heart disease  history of an ectopic or tubal pregnancy  immune system problems  IUD in place  liver disease or tumor  problems with blood clots or take blood-thinners  seizures  use intravenous drugs  uterus of unusual shape  vaginal bleeding that has not been explained  an unusual or allergic reaction to levonorgestrel, other hormones, silicone, or polyethylene, medicines, foods, dyes, or preservatives  pregnant or trying to get pregnant  breast-feeding How should I use this medicine? This device is placed inside the  uterus by a health care professional. Talk to your pediatrician regarding the use of this medicine in children. Special care may be needed. Overdosage: If you think you have taken too much of this medicine contact a poison control center or emergency room at once. NOTE: This medicine is only for you. Do not share this medicine with others. What if I miss a dose? This does not apply. Depending on the brand of device you have inserted, the device will need to be replaced every 3 to 6 years if you wish to continue using this type of birth control. What may interact with this medicine? Do not take this medicine with any of the following medications:  amprenavir  bosentan  fosamprenavir This medicine may also interact with the following medications:  aprepitant  armodafinil  barbiturate medicines for inducing sleep or treating seizures  bexarotene  boceprevir  griseofulvin  medicines to treat seizures like carbamazepine, ethotoin, felbamate, oxcarbazepine, phenytoin, topiramate  modafinil  pioglitazone  rifabutin  rifampin  rifapentine  some medicines to treat HIV infection like atazanavir, efavirenz, indinavir, lopinavir, nelfinavir, tipranavir, ritonavir  St. John's wort  warfarin This list may not describe all possible interactions. Give your health care provider a list of all the medicines, herbs, non-prescription drugs, or dietary supplements you use. Also  tell them if you smoke, drink alcohol, or use illegal drugs. Some items may interact with your medicine. What should I watch for while using this medicine? Visit your doctor or health care professional for regular check ups. See your doctor if you or your partner has sexual contact with others, becomes HIV positive, or gets a sexual transmitted disease. This product does not protect you against HIV infection (AIDS) or other sexually transmitted diseases. You can check the placement of the IUD yourself by reaching up  to the top of your vagina with clean fingers to feel the threads. Do not pull on the threads. It is a good habit to check placement after each menstrual period. Call your doctor right away if you feel more of the IUD than just the threads or if you cannot feel the threads at all. The IUD may come out by itself. You may become pregnant if the device comes out. If you notice that the IUD has come out use a backup birth control method like condoms and call your health care provider. Using tampons will not change the position of the IUD and are okay to use during your period. This IUD can be safely scanned with magnetic resonance imaging (MRI) only under specific conditions. Before you have an MRI, tell your healthcare provider that you have an IUD in place, and which type of IUD you have in place. What side effects may I notice from receiving this medicine? Side effects that you should report to your doctor or health care professional as soon as possible:  allergic reactions like skin rash, itching or hives, swelling of the face, lips, or tongue  fever, flu-like symptoms  genital sores  high blood pressure  no menstrual period for 6 weeks during use  pain, swelling, warmth in the leg  pelvic pain or tenderness  severe or sudden headache  signs of pregnancy  stomach cramping  sudden shortness of breath  trouble with balance, talking, or walking  unusual vaginal bleeding, discharge  yellowing of the eyes or skin Side effects that usually do not require medical attention (report to your doctor or health care professional if they continue or are bothersome):  acne  breast pain  change in sex drive or performance  changes in weight  cramping, dizziness, or faintness while the device is being inserted  headache  irregular menstrual bleeding within first 3 to 6 months of use  nausea This list may not describe all possible side effects. Call your doctor for medical advice  about side effects. You may report side effects to FDA at 1-800-FDA-1088. Where should I keep my medicine? This does not apply. NOTE: This sheet is a summary. It may not cover all possible information. If you have questions about this medicine, talk to your doctor, pharmacist, or health care provider.  2020 Elsevier/Gold Standard (2017-11-11 13:22:01)

## 2019-05-28 NOTE — Progress Notes (Signed)
   PRENATAL VISIT NOTE  Subjective:  Crystal Bradford is a 29 y.o. G1P0 at [redacted]w[redacted]d being seen today for ongoing prenatal care.  She is currently monitored for the following issues for this low-risk pregnancy and has Supervision of normal first pregnancy, antepartum and COVID-19 affecting pregnancy in third trimester on their problem list.  Patient reports no complaints.  Contractions: Not present. Vag. Bleeding: None.  Movement: Present. Denies leaking of fluid.   The following portions of the patient's history were reviewed and updated as appropriate: allergies, current medications, past family history, past medical history, past social history, past surgical history and problem list.   Objective:   Vitals:   05/28/19 0828  BP: (!) 115/58  Pulse: (!) 105  Weight: 231 lb (104.8 kg)    Fetal Status: Fetal Heart Rate (bpm): 140   Movement: Present     General:  Alert, oriented and cooperative. Patient is in no acute distress.  Skin: Skin is warm and dry. No rash noted.   Cardiovascular: Normal heart rate noted  Respiratory: Normal respiratory effort, no problems with respiration noted  Abdomen: Soft, gravid, appropriate for gestational age.  Pain/Pressure: Present     Pelvic: Cervical exam performed in the presence of a chaperone        Extremities: Normal range of motion.  Edema: Trace  Mental Status: Normal mood and affect. Normal behavior. Normal judgment and thought content.   Assessment and Plan:  Pregnancy: G1P0 at [redacted]w[redacted]d 1. Supervision of normal first pregnancy, antepartum FH and FHR WNL Reviewed labor precations Reviewed contraception counseling  2. COVID-19 affecting pregnancy in third trimester 3/24  Preterm labor symptoms and general obstetric precautions including but not limited to vaginal bleeding, contractions, leaking of fluid and fetal movement were reviewed in detail with the patient. Please refer to After Visit Summary for other counseling recommendations.    Return in about 2 weeks (around 06/11/2019) for in person.  Future Appointments  Date Time Provider Department Center  06/04/2019  8:15 AM Levie Heritage, DO [facility] None  06/11/2019  8:15 AM Anyanwu, Jethro Bastos, MD [facility] None  06/18/2019  8:15 AM Willodean Rosenthal, MD [facility] None    Willodean Rosenthal, MD

## 2019-05-31 LAB — GC/CHLAMYDIA PROBE AMP (~~LOC~~) NOT AT ARMC
Chlamydia: NEGATIVE
Comment: NEGATIVE
Comment: NORMAL
Neisseria Gonorrhea: NEGATIVE

## 2019-06-01 LAB — CULTURE, BETA STREP (GROUP B ONLY): Strep Gp B Culture: NEGATIVE

## 2019-06-04 ENCOUNTER — Ambulatory Visit (INDEPENDENT_AMBULATORY_CARE_PROVIDER_SITE_OTHER): Payer: 59 | Admitting: Family Medicine

## 2019-06-04 ENCOUNTER — Other Ambulatory Visit: Payer: Self-pay

## 2019-06-04 VITALS — BP 129/52 | HR 108 | Wt 231.0 lb

## 2019-06-04 DIAGNOSIS — O98513 Other viral diseases complicating pregnancy, third trimester: Secondary | ICD-10-CM

## 2019-06-04 DIAGNOSIS — Z3A37 37 weeks gestation of pregnancy: Secondary | ICD-10-CM

## 2019-06-04 DIAGNOSIS — Z3400 Encounter for supervision of normal first pregnancy, unspecified trimester: Secondary | ICD-10-CM

## 2019-06-04 DIAGNOSIS — U071 COVID-19: Secondary | ICD-10-CM

## 2019-06-04 NOTE — Progress Notes (Signed)
   PRENATAL VISIT NOTE  Subjective:  Crystal Bradford is a 29 y.o. G1P0 at [redacted]w[redacted]d being seen today for ongoing prenatal care.  She is currently monitored for the following issues for this low-risk pregnancy and has Supervision of normal first pregnancy, antepartum and COVID-19 affecting pregnancy in third trimester on their problem list.  Patient reports occasional contractions.  Contractions: Not present. Vag. Bleeding: None.  Movement: Present. Denies leaking of fluid.   The following portions of the patient's history were reviewed and updated as appropriate: allergies, current medications, past family history, past medical history, past social history, past surgical history and problem list.   Objective:   Vitals:   06/04/19 0819  BP: (!) 129/52  Pulse: (!) 108  Weight: 231 lb (104.8 kg)    Fetal Status: Fetal Heart Rate (bpm): 152 Fundal Height: 36 cm Movement: Present  Presentation: Vertex  General:  Alert, oriented and cooperative. Patient is in no acute distress.  Skin: Skin is warm and dry. No rash noted.   Cardiovascular: Normal heart rate noted  Respiratory: Normal respiratory effort, no problems with respiration noted  Abdomen: Soft, gravid, appropriate for gestational age.  Pain/Pressure: Present     Pelvic: Cervical exam deferred        Extremities: Normal range of motion.  Edema: Trace  Mental Status: Normal mood and affect. Normal behavior. Normal judgment and thought content.   Assessment and Plan:  Pregnancy: G1P0 at [redacted]w[redacted]d 1. Supervision of normal first pregnancy, antepartum FHT and fh normal.   2. COVID-19 affecting pregnancy in third trimester No complications. Back to baseline   Term labor symptoms and general obstetric precautions including but not limited to vaginal bleeding, contractions, leaking of fluid and fetal movement were reviewed in detail with the patient. Please refer to After Visit Summary for other counseling recommendations.   No follow-ups  on file.  Future Appointments  Date Time Provider Department Center  06/11/2019  8:15 AM Anyanwu, Jethro Bastos, MD [facility] None  06/18/2019  8:15 AM Willodean Rosenthal, MD [facility] None    Levie Heritage, DO

## 2019-06-11 ENCOUNTER — Other Ambulatory Visit: Payer: Self-pay

## 2019-06-11 ENCOUNTER — Encounter: Payer: Self-pay | Admitting: Obstetrics & Gynecology

## 2019-06-11 ENCOUNTER — Ambulatory Visit (INDEPENDENT_AMBULATORY_CARE_PROVIDER_SITE_OTHER): Payer: 59 | Admitting: Obstetrics & Gynecology

## 2019-06-11 VITALS — BP 110/74 | HR 98 | Wt 233.0 lb

## 2019-06-11 DIAGNOSIS — Z3400 Encounter for supervision of normal first pregnancy, unspecified trimester: Secondary | ICD-10-CM

## 2019-06-11 DIAGNOSIS — Z3403 Encounter for supervision of normal first pregnancy, third trimester: Secondary | ICD-10-CM

## 2019-06-11 DIAGNOSIS — Z3A38 38 weeks gestation of pregnancy: Secondary | ICD-10-CM

## 2019-06-11 NOTE — Progress Notes (Signed)
   PRENATAL VISIT NOTE  Subjective:  Crystal Bradford is a 29 y.o. G1P0 at [redacted]w[redacted]d being seen today for ongoing prenatal care.  She is currently monitored for the following issues for this low-risk pregnancy and has Supervision of normal first pregnancy, antepartum and COVID-19 affecting pregnancy in third trimester on their problem list.  Patient reports occasional contractions.  Contractions: Irritability. Vag. Bleeding: None.  Movement: Present. Denies leaking of fluid.   The following portions of the patient's history were reviewed and updated as appropriate: allergies, current medications, past family history, past medical history, past social history, past surgical history and problem list.   Objective:   Vitals:   06/11/19 0821  BP: 110/74  Pulse: 98  Weight: 233 lb (105.7 kg)    Fetal Status: Fetal Heart Rate (bpm): 140 Fundal Height: 38 cm Movement: Present  Presentation: Vertex  General:  Alert, oriented and cooperative. Patient is in no acute distress.  Skin: Skin is warm and dry. No rash noted.   Cardiovascular: Normal heart rate noted  Respiratory: Normal respiratory effort, no problems with respiration noted  Abdomen: Soft, gravid, appropriate for gestational age.  Pain/Pressure: Present     Pelvic: Cervical exam performed in the presence of a chaperone Dilation: 4 Effacement (%): 70 Station: -2  Extremities: Normal range of motion.  Edema: None  Mental Status: Normal mood and affect. Normal behavior. Normal judgment and thought content.   Assessment and Plan:  Pregnancy: G1P0 at [redacted]w[redacted]d 1. [redacted] weeks gestation of pregnancy 2. Supervision of normal first pregnancy, antepartum Favorable cervix, patient is very happy.  Labor symptoms and general obstetric precautions including but not limited to vaginal bleeding, contractions, leaking of fluid and fetal movement were reviewed in detail with the patient. Please refer to After Visit Summary for other counseling recommendations.    Return in about 1 week (around 06/18/2019) for OFFICE OB Visit.  Future Appointments  Date Time Provider Department Center  06/18/2019  8:15 AM Willodean Rosenthal, MD [facility] None    Jaynie Collins, MD

## 2019-06-17 ENCOUNTER — Other Ambulatory Visit: Payer: Self-pay

## 2019-06-17 ENCOUNTER — Encounter (HOSPITAL_COMMUNITY): Payer: Self-pay | Admitting: Obstetrics & Gynecology

## 2019-06-17 ENCOUNTER — Inpatient Hospital Stay (HOSPITAL_COMMUNITY)
Admission: AD | Admit: 2019-06-17 | Discharge: 2019-06-20 | DRG: 807 | Disposition: A | Discharge status: Home / Self Care | Payer: 59 | Attending: Obstetrics and Gynecology | Admitting: Obstetrics and Gynecology

## 2019-06-17 DIAGNOSIS — Z3A39 39 weeks gestation of pregnancy: Secondary | ICD-10-CM | POA: Diagnosis not present

## 2019-06-17 DIAGNOSIS — O26893 Other specified pregnancy related conditions, third trimester: Secondary | ICD-10-CM | POA: Diagnosis present

## 2019-06-17 DIAGNOSIS — O4292 Full-term premature rupture of membranes, unspecified as to length of time between rupture and onset of labor: Principal | ICD-10-CM | POA: Diagnosis present

## 2019-06-17 DIAGNOSIS — Z20822 Contact with and (suspected) exposure to covid-19: Secondary | ICD-10-CM | POA: Diagnosis present

## 2019-06-17 DIAGNOSIS — O4290 Premature rupture of membranes, unspecified as to length of time between rupture and onset of labor, unspecified weeks of gestation: Secondary | ICD-10-CM

## 2019-06-17 DIAGNOSIS — Z8616 Personal history of COVID-19: Secondary | ICD-10-CM | POA: Diagnosis not present

## 2019-06-17 DIAGNOSIS — Z3A38 38 weeks gestation of pregnancy: Secondary | ICD-10-CM | POA: Diagnosis not present

## 2019-06-17 DIAGNOSIS — O4202 Full-term premature rupture of membranes, onset of labor within 24 hours of rupture: Secondary | ICD-10-CM | POA: Diagnosis not present

## 2019-06-17 DIAGNOSIS — U071 COVID-19: Secondary | ICD-10-CM | POA: Diagnosis present

## 2019-06-17 DIAGNOSIS — O98513 Other viral diseases complicating pregnancy, third trimester: Secondary | ICD-10-CM | POA: Diagnosis present

## 2019-06-17 DIAGNOSIS — Z3400 Encounter for supervision of normal first pregnancy, unspecified trimester: Secondary | ICD-10-CM

## 2019-06-17 LAB — COMPREHENSIVE METABOLIC PANEL
ALT: 18 U/L (ref 0–44)
AST: 18 U/L (ref 15–41)
Albumin: 2.6 g/dL — ABNORMAL LOW (ref 3.5–5.0)
Alkaline Phosphatase: 138 U/L — ABNORMAL HIGH (ref 38–126)
Anion gap: 12 (ref 5–15)
BUN: 8 mg/dL (ref 6–20)
CO2: 17 mmol/L — ABNORMAL LOW (ref 22–32)
Calcium: 9.1 mg/dL (ref 8.9–10.3)
Chloride: 106 mmol/L (ref 98–111)
Creatinine, Ser: 0.62 mg/dL (ref 0.44–1.00)
GFR calc Af Amer: >60 mL/min (ref >60)
GFR calc non Af Amer: >60 mL/min (ref >60)
Glucose, Bld: 113 mg/dL — ABNORMAL HIGH (ref 70–99)
Potassium: 3.9 mmol/L (ref 3.5–5.1)
Sodium: 135 mmol/L (ref 135–145)
Total Bilirubin: 0.4 mg/dL (ref 0.3–1.2)
Total Protein: 6.6 g/dL (ref 6.5–8.1)

## 2019-06-17 LAB — PROTEIN / CREATININE RATIO, URINE
Creatinine, Urine: 54.23 mg/dL
Protein Creatinine Ratio: 0.33 mg/mg{Cre} — ABNORMAL HIGH (ref 0.00–0.15)
Total Protein, Urine: 18 mg/dL

## 2019-06-17 LAB — CBC
HCT: 39.5 % (ref 36.0–46.0)
Hemoglobin: 13 g/dL (ref 12.0–15.0)
MCH: 29 pg (ref 26.0–34.0)
MCHC: 32.9 g/dL (ref 30.0–36.0)
MCV: 88 fL (ref 80.0–100.0)
Platelets: 271 10*3/uL (ref 150–400)
RBC: 4.49 MIL/uL (ref 3.87–5.11)
RDW: 13.9 % (ref 11.5–15.5)
WBC: 10.9 10*3/uL — ABNORMAL HIGH (ref 4.0–10.5)
nRBC: 0 % (ref 0.0–0.2)

## 2019-06-17 LAB — POCT FERN TEST: POCT Fern Test: POSITIVE

## 2019-06-17 LAB — ABO/RH: ABO/RH(D): A POS

## 2019-06-17 LAB — SARS CORONAVIRUS 2 BY RT PCR (HOSPITAL ORDER, PERFORMED IN ~~LOC~~ HOSPITAL LAB): SARS Coronavirus 2: NEGATIVE

## 2019-06-17 LAB — TYPE AND SCREEN
ABO/RH(D): A POS
Antibody Screen: NEGATIVE

## 2019-06-17 MED ORDER — LACTATED RINGERS IV SOLN: INTRAVENOUS | Status: DC

## 2019-06-17 MED ORDER — OXYTOCIN-SODIUM CHLORIDE 30-0.9 UT/500ML-% IV SOLN
1.0000 m[IU]/min | INTRAVENOUS | Status: DC
  Administered 2019-06-18: 2 m[IU]/min via INTRAVENOUS
  Filled 2019-06-17: qty 500

## 2019-06-17 MED ORDER — ONDANSETRON HCL 4 MG/2ML IJ SOLN: 4.0000 mg | Freq: Four times a day (QID) | INTRAMUSCULAR | Status: DC | PRN

## 2019-06-17 MED ORDER — OXYTOCIN BOLUS FROM INFUSION
500.0000 mL | Freq: Once | INTRAVENOUS | Status: AC
  Administered 2019-06-18: 500 mL via INTRAVENOUS

## 2019-06-17 MED ORDER — MISOPROSTOL 50MCG HALF TABLET
50.0000 ug | ORAL_TABLET | ORAL | Status: DC | PRN
  Administered 2019-06-17: 50 ug via BUCCAL
  Filled 2019-06-17: qty 1

## 2019-06-17 MED ORDER — LIDOCAINE HCL (PF) 1 % IJ SOLN: 30.0000 mL | INTRAMUSCULAR | Status: DC | PRN

## 2019-06-17 MED ORDER — LACTATED RINGERS IV SOLN
500.0000 mL | INTRAVENOUS | Status: DC | PRN
  Administered 2019-06-17: 500 mL via INTRAVENOUS

## 2019-06-17 MED ORDER — ACETAMINOPHEN 325 MG PO TABS: 650.0000 mg | ORAL_TABLET | ORAL | Status: DC | PRN

## 2019-06-17 MED ORDER — SOD CITRATE-CITRIC ACID 500-334 MG/5ML PO SOLN: 30.0000 mL | ORAL | Status: DC | PRN

## 2019-06-17 MED ORDER — TERBUTALINE SULFATE 1 MG/ML IJ SOLN: 0.2500 mg | Freq: Once | INTRAMUSCULAR | Status: DC | PRN

## 2019-06-17 MED ORDER — OXYTOCIN-SODIUM CHLORIDE 30-0.9 UT/500ML-% IV SOLN
2.5000 [IU]/h | INTRAVENOUS | Status: DC
  Administered 2019-06-18: 2.5 [IU]/h via INTRAVENOUS
  Filled 2019-06-17: qty 500

## 2019-06-17 MED ORDER — FENTANYL CITRATE (PF) 100 MCG/2ML IJ SOLN
100.0000 ug | INTRAMUSCULAR | Status: DC | PRN
  Administered 2019-06-18 (×3): 100 ug via INTRAVENOUS
  Filled 2019-06-17 (×3): qty 2

## 2019-06-17 NOTE — H&P (Addendum)
LABOR AND DELIVERY ADMISSION HISTORY AND PHYSICAL NOTE  Crystal Bradford is a 29 y.o. female G1P0 with IUP at [redacted]w[redacted]d by LMP c/w 9 wk [place] presenting for SROM at 1545 with clear fluid.  She reports positive fetal movement. She denies vaginal bleeding.  Prenatal History/Complications: PNC at [facility] Pregnancy complications:  - None  Past Medical History: Past Medical History:  Diagnosis Date   Medical history non-contributory     Past Surgical History: Past Surgical History:  Procedure Laterality Date   NO PAST SURGERIES      Obstetrical History: OB History     Gravida  1   Para      Term      Preterm      AB      Living         SAB      TAB      Ectopic      Multiple      Live Births              Social History: Social History   Socioeconomic History   Marital status: Married    Spouse name: Not on file   Number of children: Not on file   Years of education: Not on file   Highest education level: Not on file  Occupational History   Not on file  Tobacco Use   Smoking status: Never Smoker   Smokeless tobacco: Never Used  Substance and Sexual Activity   Alcohol use: Never   Drug use: Never   Sexual activity: Yes    Birth control/protection: None  Other Topics Concern   Not on file  Social History Narrative   Not on file   Social Determinants of Health   Financial Resource Strain:    Difficulty of Paying Living Expenses:   Food Insecurity:    Worried About Programme researcher, broadcasting/film/video in the Last Year:    Barista in the Last Year:   Transportation Needs:    Freight forwarder (Medical):    Lack of Transportation (Non-Medical):   Physical Activity:    Days of Exercise per Week:    Minutes of Exercise per Session:   Stress:    Feeling of Stress :   Social Connections:    Frequency of Communication with Friends and Family:    Frequency of Social Gatherings with Friends and Family:    Attends Religious Services:    Active Member  of Clubs or Organizations:    Attends Engineer, structural:    Marital Status:     Family History: Family History  Problem Relation Age of Onset   Hypertension Mother    Diabetes Mother    Arthritis Mother     Allergies: Allergies  Allergen Reactions   Penicillin G Rash   Amoxicillin Hives and Swelling    Medications Prior to Admission  Medication Sig Dispense Refill Last Dose   Prenatal Vit-Fe Fumarate-FA (PRENATAL VITAMINS PO) Take by mouth.   06/17/2019 at Unknown time     Review of Systems  All systems reviewed and negative except as stated in HPI  Physical Exam Blood pressure (!) 125/52, pulse (!) 104, temperature 99.2 F (37.3 C), temperature source Oral, resp. rate 18, height 5\' 5"  (1.651 m), weight 106.2 kg, last menstrual period 09/18/2018, SpO2 97 %. General appearance: alert, oriented, NAD Lungs: normal respiratory effort Heart: regular rate Abdomen: soft, non-tender; gravid, FH appropriate for GA Extremities: No calf swelling or tenderness  Presentation: cephalic Fetal monitoring: 155/moderate/+accels/-decels Uterine activity: Contractions q4-6    Prenatal labs: ABO, Rh: --/--/PENDING (06/03 1752) Antibody: PENDING (06/03 1752) Rubella: 5.36 (11/12 0937) RPR: Non Reactive (03/04 0923)  HBsAg: Negative (11/12 0937)  HIV: Non Reactive (03/04 0923)  GC/Chlamydia: Negative GBS: Negative/-- (05/14 0850)  2-hr GTT: Passed Genetic screening:  NIPS low risk female, AFP negative Anatomy US: Normal  Prenatal Transfer Tool  Maternal Diabetes: No Genetic Screening: Normal Maternal Ultrasounds/Referrals: Normal Fetal Ultrasounds or other Referrals:  None Maternal Substance Abuse:  No Significant Maternal Medications:  None Significant Maternal Lab Results: Group B Strep negative  Results for orders placed or performed during the hospital encounter of 06/17/19 (from the past 24 hour(s))  Type and screen [place]   Collection  Time: 06/17/19  5:52 PM  Result Value Ref Range   ABO/RH(D) PENDING    Antibody Screen PENDING    Sample Expiration      06/20/2019,2359 Performed at [facility], [place] [address], [place], [place] [address]   CBC   Collection Time: 06/17/19  6:02 PM  Result Value Ref Range   WBC 10.9 (H) 4.0 - 10.5 K/uL   RBC 4.49 3.87 - 5.11 MIL/uL   Hemoglobin 13.0 12.0 - 15.0 g/dL   HCT 39.5 36.0 - 46.0 %   MCV 88.0 80.0 - 100.0 fL   MCH 29.0 26.0 - 34.0 pg   MCHC 32.9 30.0 - 36.0 g/dL   RDW 13.9 11.5 - 15.5 %   Platelets 271 150 - 400 K/uL   nRBC 0.0 0.0 - 0.2 %  POCT fern test   Collection Time: 06/17/19  6:07 PM  Result Value Ref Range   POCT Fern Test Positive = ruptured amniotic membanes     Patient Active Problem List   Diagnosis Date Noted   PROM (premature rupture of membranes) 06/17/2019   Labor and delivery, indication for care 06/17/2019   COVID-19 affecting pregnancy in third trimester 04/15/2019   Supervision of normal first pregnancy, antepartum 11/26/2018    Assessment: Crystal Bradford is a 29 y.o. G1P0 at [redacted]w[redacted]d here for SOL with SROM.  #Labor: Expectant management, expect vaginal delivery.  If ctx aren't stronger by 2200, will give a cytotec followed by Pitocin (discussed options w/pt, this is her plan).  #Pain: IV pain meds #FWB: Cat I #ID:  GBS negative #MOF: Breast #MOC:IUD at [place] visit #Circ:  Yes  EMILY Madelin Headings, MD PGY-2 Resident Family Medicine 06/17/2019, 6:58 PM  Christin Fudge

## 2019-06-17 NOTE — MAU Note (Addendum)
Pt states she SROM'd around 1550. Had had about an hour of contractions before that, but none now. Denies bleeding. +FM. Was 4cm at last prenatal visit.

## 2019-06-18 ENCOUNTER — Encounter: Payer: 59 | Admitting: Obstetrics & Gynecology

## 2019-06-18 ENCOUNTER — Encounter (HOSPITAL_COMMUNITY): Payer: Self-pay | Admitting: Anesthesiology

## 2019-06-18 ENCOUNTER — Encounter (HOSPITAL_COMMUNITY): Payer: Self-pay | Admitting: Obstetrics & Gynecology

## 2019-06-18 DIAGNOSIS — O4202 Full-term premature rupture of membranes, onset of labor within 24 hours of rupture: Secondary | ICD-10-CM

## 2019-06-18 DIAGNOSIS — Z3A39 39 weeks gestation of pregnancy: Secondary | ICD-10-CM

## 2019-06-18 LAB — RPR: RPR Ser Ql: NONREACTIVE

## 2019-06-18 MED ORDER — IBUPROFEN 600 MG PO TABS
600.0000 mg | ORAL_TABLET | Freq: Three times a day (TID) | ORAL | Status: DC | PRN
  Administered 2019-06-18 – 2019-06-20 (×5): 600 mg via ORAL
  Filled 2019-06-18 (×5): qty 1

## 2019-06-18 MED ORDER — ONDANSETRON HCL 4 MG/2ML IJ SOLN: 4.0000 mg | INTRAMUSCULAR | Status: DC | PRN

## 2019-06-18 MED ORDER — DIBUCAINE (PERIANAL) 1 % EX OINT: 1.0000 "application " | TOPICAL_OINTMENT | CUTANEOUS | Status: DC | PRN

## 2019-06-18 MED ORDER — ACETAMINOPHEN 325 MG PO TABS: 650.0000 mg | ORAL_TABLET | Freq: Four times a day (QID) | ORAL | Status: DC | PRN

## 2019-06-18 MED ORDER — TETANUS-DIPHTH-ACELL PERTUSSIS 5-2.5-18.5 LF-MCG/0.5 IM SUSP: 0.5000 mL | Freq: Once | INTRAMUSCULAR | Status: DC

## 2019-06-18 MED ORDER — COCONUT OIL OIL: 1.0000 "application " | TOPICAL_OIL | Status: DC | PRN

## 2019-06-18 MED ORDER — BENZOCAINE-MENTHOL 20-0.5 % EX AERO
1.0000 "application " | INHALATION_SPRAY | CUTANEOUS | Status: DC | PRN
  Administered 2019-06-18: 1 via TOPICAL
  Filled 2019-06-18: qty 56

## 2019-06-18 MED ORDER — PRENATAL MULTIVITAMIN CH
1.0000 | ORAL_TABLET | Freq: Every day | ORAL | Status: DC
  Administered 2019-06-19: 1 via ORAL
  Filled 2019-06-18: qty 1

## 2019-06-18 MED ORDER — ONDANSETRON HCL 4 MG PO TABS: 4.0000 mg | ORAL_TABLET | ORAL | Status: DC | PRN

## 2019-06-18 MED ORDER — SIMETHICONE 80 MG PO CHEW: 80.0000 mg | CHEWABLE_TABLET | ORAL | Status: DC | PRN

## 2019-06-18 MED ORDER — SENNOSIDES-DOCUSATE SODIUM 8.6-50 MG PO TABS
2.0000 | ORAL_TABLET | ORAL | Status: DC
  Administered 2019-06-18 – 2019-06-20 (×2): 2 via ORAL
  Filled 2019-06-18 (×2): qty 2

## 2019-06-18 MED ORDER — WITCH HAZEL-GLYCERIN EX PADS: 1.0000 "application " | MEDICATED_PAD | CUTANEOUS | Status: DC | PRN

## 2019-06-18 MED ORDER — MEASLES, MUMPS & RUBELLA VAC IJ SOLR: 0.5000 mL | Freq: Once | INTRAMUSCULAR | Status: DC

## 2019-06-18 MED ORDER — DIPHENHYDRAMINE HCL 25 MG PO CAPS: 25.0000 mg | ORAL_CAPSULE | Freq: Four times a day (QID) | ORAL | Status: DC | PRN

## 2019-06-18 NOTE — Anesthesia Preprocedure Evaluation (Deleted)
Anesthesia Evaluation    Reviewed: Allergy & Precautions, Patient's Chart, lab work & pertinent test results  Airway        Dental   Pulmonary neg pulmonary ROS,           Cardiovascular negative cardio ROS       Neuro/Psych negative neurological ROS     GI/Hepatic negative GI ROS, Neg liver ROS,   Endo/Other  negative endocrine ROS  Renal/GU negative Renal ROS     Musculoskeletal negative musculoskeletal ROS (+)   Abdominal   Peds  Hematology Lab Results      Component                Value               Date                      WBC                      10.9 (H)            06/17/2019                HGB                      13.0                06/17/2019                HCT                      39.5                06/17/2019                MCV                      88.0                06/17/2019                PLT                      271                 06/17/2019             Anesthesia Other Findings   Reproductive/Obstetrics (+) Pregnancy                             Anesthesia Physical Anesthesia Plan  ASA: III  Anesthesia Plan: Epidural   Post-op Pain Management:    Induction:   PONV Risk Score and Plan:   Airway Management Planned:   Additional Equipment: None  Intra-op Plan:   Post-operative Plan:   Informed Consent:   Plan Discussed with:   Anesthesia Plan Comments: (39 wk G1Po for LEA)        Anesthesia Quick Evaluation

## 2019-06-18 NOTE — Discharge Summary (Signed)
Postpartum Discharge Summary  Date of Service updated 06/20/19     Patient Name: Crystal Bradford DOB: 01-Aug-1990 MRN: [id number]  Date of admission: 06/17/2019 Delivery date:06/18/2019  Delivering provider: Lenna Sciara  Date of discharge: 06/20/2019  Admitting diagnosis: Labor and delivery, indication for care [O75.9] Intrauterine pregnancy: [redacted]w[redacted]d    Secondary diagnosis:  Active Problems:   Supervision of normal first pregnancy, antepartum   COVID-19 affecting pregnancy in third trimester   PROM (premature rupture of membranes)   Labor and delivery, indication for care  Additional problems: None    Discharge diagnosis: Term Pregnancy Delivered                                              Post partum procedures:None Augmentation: Pitocin and Cytotec Complications: None  Hospital course: Onset of Labor With Vaginal Delivery      29y.o. yo G1P0 at 365w0das admitted in Latent Labor on 06/17/2019. Patient had an uncomplicated labor course as follows: Initial SVE: 4.5/60/-2 with SROM earlier that day. Patient received Cytotec and Pitocin and progressed to complete without complication.  Membrane Rupture Time/Date: 3:50 PM ,06/17/2019   Delivery Method:Vaginal, Spontaneous  Episiotomy: None  Lacerations:  Perineal;1st degree  Patient had an uncomplicated postpartum course.  She is ambulating, tolerating a regular diet, passing flatus, and urinating well. Patient is discharged home in stable condition on 06/20/19.  Newborn Data: Birth date:06/18/2019  Birth time:12:22 PM  Gender:Female  Living status:Living  Apgars:8 ,9  Weight:3790 g   Magnesium Sulfate received: No BMZ received: No Rhophylac:No MMR:No T-DaP:Given prenatally Flu: No Transfusion:No  Physical exam  Vitals:   06/19/19 0431 06/19/19 1500 06/19/19 2145 06/20/19 0522  BP: 126/71 128/78 131/62 (!) 120/58  Pulse: 100 96 95 88  Resp: '18  18 18  ' Temp: 98.8 F (37.1 C) 98.7 F (37.1 C) 98.1 F (36.7 C) 98.4 F  (36.9 C)  TempSrc: Oral Oral Oral Oral  SpO2: 98% 98% 99% 98%  Weight:      Height:       General: alert, cooperative and no distress Lochia: appropriate Uterine Fundus: firm Incision: N/A DVT Evaluation: No evidence of DVT seen on physical exam. Labs: Lab Results  Component Value Date   WBC 10.9 (H) 06/17/2019   HGB 13.0 06/17/2019   HCT 39.5 06/17/2019   MCV 88.0 06/17/2019   PLT 271 06/17/2019   CMP Latest Ref Rng & Units 06/17/2019  Glucose 70 - 99 mg/dL 113(H)  BUN 6 - 20 mg/dL 8  Creatinine 0.44 - 1.00 mg/dL 0.62  Sodium 135 - 145 mmol/L 135  Potassium 3.5 - 5.1 mmol/L 3.9  Chloride 98 - 111 mmol/L 106  CO2 22 - 32 mmol/L 17(L)  Calcium 8.9 - 10.3 mg/dL 9.1  Total Protein 6.5 - 8.1 g/dL 6.6  Total Bilirubin 0.3 - 1.2 mg/dL 0.4  Alkaline Phos 38 - 126 U/L 138(H)  AST 15 - 41 U/L 18  ALT 0 - 44 U/L 18   Edinburgh Score: Edinburgh Postnatal Depression Scale Screening Tool 06/19/2019  I have been able to laugh and see the funny side of things. 0  I have looked forward with enjoyment to things. 0  I have blamed myself unnecessarily when things went wrong. 1  I have been anxious or worried for no good reason. 1  I have  felt scared or panicky for no good reason. 0  Things have been getting on top of me. 0  I have been so unhappy that I have had difficulty sleeping. 0  I have felt sad or miserable. 0  I have been so unhappy that I have been crying. 0  The thought of harming myself has occurred to me. 0  Edinburgh Postnatal Depression Scale Total 2     After visit meds:  Allergies as of 06/20/2019      Reactions   Penicillin G Rash   Amoxicillin Hives, Swelling      Medication List    TAKE these medications   ibuprofen 600 MG tablet Commonly known as: ADVIL Take 1 tablet (600 mg total) by mouth every 8 (eight) hours as needed for mild pain.   PRENATAL VITAMINS PO Take by mouth.        Discharge home in stable condition Infant Feeding: Breast Infant  Disposition:home with mother Discharge instruction: per After Visit Summary and Postpartum booklet. Activity: Advance as tolerated. Pelvic rest for 6 weeks.  Diet: routine diet Future Appointments:No future appointments. Follow up Visit: [facility] [place] Follow up.   Specialty: Obstetrics and Gynecology Why: In 4-6 weeks, in office visit if IUD is desired. Contact information: [address] [place] [address] [phone]           Please schedule this patient for a Virtual postpartum visit in 4 weeks with the following provider: Any provider. Additional Postpartum F/U:None  Low risk pregnancy complicated by: none Delivery mode:  Vaginal, Spontaneous  Anticipated Birth Control:  decide at [facility] visit--Maybe IUD   06/20/2019 Fatima Blank, CNM

## 2019-06-18 NOTE — Lactation Note (Signed)
This note was copied from a baby's chart. Lactation Consultation Note Baby 9 hrs old. Mom stated baby hasn't been very hungry. Baby has latched several times but not for long. Baby has been spitting up a lot. Newborn feeding habits, behavior, STS, I&O, breast massage, milk storage, supply and demand discussed. Mom asked about when should she start pumping before she goes to work. LC discussed w/mom pumping and building milk supply and storage.  Mom has semi flat nipples at rest, everts w/stimulation. Gave mom hand pump for pre-pumping if needed. Encouraged finger stimulation if she doesn't pre-pump. Mom is very compressible and baby latches well. Stressed importance of cheeks to breast. Shells given for mom to wear in am.  Mom encouraged to feed baby 8-12 times/24 hours and with feeding cues.  Baby BF for 10 minutes. Slid off of breast a couple of times to nipple. Noted compression of nipple when unlatched. Mom massaging breast pulling back on breast tissue, encouraged not to pull back.  Baby re-latched easily. Mom denies painful latches. Demonstrated getting baby to open wide before latching and chin tug if needed.  Positions, support, safety and comfort discussed for feedings. Encouraged mom to call for assistance if needed. Lactation brochure given.  Patient Name: Boy Yahaira Bruski [id number] Date: 06/18/2019 Reason for consult: Initial assessment;Primapara;Term   Maternal Data Has patient been taught Hand Expression?: Yes Does the patient have breastfeeding experience prior to this delivery?: No  Feeding Feeding Type: Breast Fed  LATCH Score Latch: Grasps breast easily, tongue down, lips flanged, rhythmical sucking.  Audible Swallowing: A few with stimulation  Type of Nipple: Everted at rest and after stimulation(semi flat/very short shaft)  Comfort (Breast/Nipple): Soft / non-tender  Hold (Positioning): Assistance needed to correctly position infant at breast and maintain  latch.  LATCH Score: 8  Interventions Interventions: Breast feeding basics reviewed;Support pillows;Assisted with latch;Position options;Skin to skin;Breast massage;Hand express;Pre-pump if needed;Shells;Breast compression;Adjust position;Hand pump  Lactation Tools Discussed/Used Tools: Shells;Pump Shell Type: Inverted Breast pump type: Manual WIC Program: No Pump Review: Setup, frequency, and cleaning;Milk Storage Initiated by:: Peri Jefferson RN IBCLC Date initiated:: 06/18/19   Consult Status Consult Status: Follow-up Date: 06/19/19 Follow-up type: In-patient    Charyl Dancer 06/18/2019, 9:30 PM

## 2019-06-18 NOTE — Progress Notes (Signed)
Labor Progress Note Crystal Bradford is a 29 y.o. G1P0 at 3w0dpresented for SOL/SROM. S: Coping well. Met patient and discussed plan.  O:  BP 128/72   Pulse (!) 112   Temp 98.3 F (36.8 C) (Oral)   Resp 18   Ht '5\' 5"'  (1.651 m)   Wt 106.2 kg   LMP 09/18/2018 (Exact Date)   SpO2 97%   BMI 38.96 kg/m  EFM: 135, moderate variability, pos accels, no decels, reactive TOCO: q3-431mCVE: Dilation: 7.5 Effacement (%): 90 Cervical Position: Anterior Station: -1 Presentation: Vertex Exam by:: C Remus BlakeN   A&P: [age].o. G1P0 3971w0dre for SOL/SROM. #Labor: Progressing well. Cont Pit, only at 4. Anticipate SVD. #Pain: per patient request; no epidural and coping well #FWB: Cat I #GBS negative  CheChauncey MannD 8:58 AM

## 2019-06-18 NOTE — Progress Notes (Signed)
Vitals:   06/18/19 0201 06/18/19 0301  BP: 123/64 129/74  Pulse: 98 99  Resp: 14 14  Temp:  98.8 F (37.1 C)  SpO2:     Pitocin started at 0300, still at 2 mu/min.  Ctx q 1.5-3 minutes. Cx now 6-7/100/-2. FHR Cat 1. Continue present mgt

## 2019-06-18 NOTE — Progress Notes (Signed)
Vitals:   06/17/19 2302 06/18/19 0100  BP: 133/72 124/63  Pulse: 90 92  Resp: 14 14  Temp: 98.8 F (37.1 C)   SpO2:    . Ctx q 2-4 minutes, but not really stronger.  FHR Cat 1.   cx 4.5/50/-2.  Pt agreeable to cyttoec PO.

## 2019-06-19 NOTE — Lactation Note (Signed)
This note was copied from a baby's chart. Lactation Consultation Note  Patient Name: Crystal Bradford [id number] Date: 06/19/2019 Reason for consult: Follow-up assessment   P1, Baby 21 hours old and requested assistance w/ latching. Mother states latching has improved and baby is less spitty. Attempted latching but baby sleepy.  Baby taken to nursery for circ. Suggest mother call next time she attempts feeding.    Maternal Data    Feeding    LATCH Score                   Interventions Interventions: Breast feeding basics reviewed;Assisted with latch  Lactation Tools Discussed/Used     Consult Status Consult Status: Follow-up Date: 06/19/19 Follow-up type: In-patient    Dahlia Byes [facility] 06/19/2019, 9:27 AM

## 2019-06-19 NOTE — Plan of Care (Signed)
  Problem: Education: Goal: Knowledge of condition will improve Outcome: Completed/Met Goal: Individualized Educational Video(s) Outcome: Completed/Met Goal: Individualized Newborn Educational Video(s) Outcome: Completed/Met   Problem: Activity: Goal: Will verbalize the importance of balancing activity with adequate rest periods Outcome: Completed/Met Goal: Ability to tolerate increased activity will improve Outcome: Completed/Met   Problem: Coping: Goal: Ability to identify and utilize available resources and services will improve Outcome: Completed/Met   Problem: Life Cycle: Goal: Chance of risk for complications during the postpartum period will decrease Outcome: Completed/Met   Problem: Role Relationship: Goal: Ability to demonstrate positive interaction with newborn will improve Outcome: Completed/Met

## 2019-06-19 NOTE — Progress Notes (Signed)
POSTPARTUM PROGRESS NOTE  Post Partum Day 1  Subjective:  Crystal Bradford is a 29 y.o. G1P1001 s/p NSVD at [redacted]w[redacted]d.  She reports she is doing well. No acute events overnight. She denies any problems with ambulating, voiding or po intake. Denies nausea or vomiting.  Pain is well controlled.  Lochia is like a period.  Objective: Blood pressure 126/71, pulse 100, temperature 98.8 F (37.1 C), temperature source Oral, resp. rate 18, height 5\' 5"  (1.651 m), weight 106.2 kg, last menstrual period 09/18/2018, SpO2 98 %, unknown if currently breastfeeding.  Physical Exam:  General: alert, cooperative and no distress Chest: no respiratory distress Heart:regular rate, distal pulses intact Abdomen: soft, nontender,  Uterine Fundus: firm, appropriately tender DVT Evaluation: No calf swelling or tenderness Extremities: no LE edema Skin: warm, dry  Recent Labs    06/17/19 1802  HGB 13.0  HCT 39.5    Assessment/Plan: Crystal Bradford is a 29 y.o. G1P1001 s/p NSVD at [redacted]w[redacted]d   PPD#1 - Doing well  Routine postpartum care Contraception: plans to discuss with OB at 6wk PP visit Feeding: breast Dispo: Plan for discharge PPD#1-2, pending baby.   LOS: 2 days   [redacted]w[redacted]d, MD/MP[facility] OB Fellow  06/19/2019, 7:22 AM

## 2019-06-20 MED ORDER — IBUPROFEN 600 MG PO TABS: 600.0000 mg | ORAL_TABLET | Freq: Three times a day (TID) | ORAL | 0 refills | Status: DC | PRN

## 2019-06-20 NOTE — Lactation Note (Signed)
This note was copied from a baby's chart. Lactation Consultation Note  Patient Name: Crystal Bradford [id number] Date: 06/20/2019 Reason for consult: Follow-up assessment;1st time breastfeeding;Primapara;Term  [phone] - 0951 - I conducted a discharge visit with Crystal Bradford. She states that her son, Crystal Bradford, breast fed well though the night last night. States that he has a good latch, and denies pain. She has been feeding with both breasts each feeding.   Crystal Bradford states that she is very committed to being successful with breast feeding. Her support person was present and also involved in the conversation. She has a personal pump at home. Her pediatrician has lactation as well; she states that she chose them because they had a Research scientist (medical). She plans to make a follow up appointment with lactation with her follow up pediatric appointment.  Baby had numerous stools in the first 24 hours but none in the second. However, he has had wet diapers, and she states that she hears him swallowing, and she sees that his mouth is wet after feedings.  I recommended that she breast feed on demand 8-12 times a day. We discussed what to expect when her milk transitions and how to manage engorgement. I suggested that she look for baby to soften one breast and then offer the other breast after.  We discussed indications for pumping. I recommended that she avoid bottles for first few weeks until breast feeding is well established, unless medically indicated to supplement.  I shared our breast feeding community resources and reviewed them. I encouraged her to call our lactation warm line if there are any questions or concerns and to attend our breast feeding support group.    Feeding Feeding Type: Breast Fed   Interventions Interventions: Breast feeding basics reviewed  Lactation Tools Discussed/Used     Consult Status Consult Status: Complete Date: 06/20/19    Walker Shadow 06/20/2019, 10:27 AM

## 2019-06-20 NOTE — Lactation Note (Signed)
This note was copied from a baby's chart. Lactation Consultation Note Attempted to see mom, mom and support person sleeping soundly.  Patient Name: Crystal Bradford [id number] Date: 06/20/2019     Maternal Data    Feeding Feeding Type: Breast Fed  LATCH Score                   Interventions    Lactation Tools Discussed/Used     Consult Status      Charyl Dancer 06/20/2019, 3:34 AM

## 2019-06-25 ENCOUNTER — Telehealth: Payer: Self-pay

## 2019-06-25 NOTE — Telephone Encounter (Signed)
Patient called stating that she delivered about one week ago. Patient states that she did pass a clot about the size of a quarter yesterday. Patient denies any fever at this time. Assured her that this is normal but if she develops fever, abdominal pain, or starts passing more clots she needs to call us back.   Patient also asking if there is anything she can do for her hemorrhoids. Patient made aware that she could still take a stool softener (she had stopped a few days ago) and could try witch hazel/tucks pads. Armandina Stammer RN

## 2019-07-22 ENCOUNTER — Encounter: Payer: Self-pay | Admitting: Family Medicine

## 2019-07-22 ENCOUNTER — Other Ambulatory Visit: Payer: Self-pay

## 2019-07-22 ENCOUNTER — Ambulatory Visit (HOSPITAL_BASED_OUTPATIENT_CLINIC_OR_DEPARTMENT_OTHER): Payer: 59 | Admitting: Family Medicine

## 2019-07-22 NOTE — Addendum Note (Signed)
Addended by: Levie Heritage on: 07/22/2019 01:55 PM   Modules accepted: Orders

## 2019-07-22 NOTE — Progress Notes (Signed)
    Post Partum Visit Note  Crystal Bradford is a 29 y.o. G51P1001 female who presents for a postpartum visit. She is 4 weeks postpartum following a normal spontaneous vaginal delivery.  I have fully reviewed the prenatal and intrapartum course. The delivery was at 39 gestational weeks.  Anesthesia: none. Postpartum course has been normal. Baby is doing well. Baby is feeding by breast. Bleeding staining only. Bowel function is normal. Bladder function is normal. Patient is not sexually active. Contraception method is none. Postpartum depression screening: negative.  The following portions of the patient's history were reviewed and updated as appropriate: allergies, current medications, past family history, past medical history, past social history, past surgical history and problem list.  Review of Systems Pertinent items are noted in HPI.    Objective:  Blood pressure 115/67, pulse 82, height 5\' 5"  (1.651 m), weight 214 lb (97.1 kg), currently breastfeeding.  General:  alert, cooperative and no distress  Lungs: clear to auscultation bilaterally  Heart:  regular rate and rhythm, S1, S2 normal, no murmur, click, rub or gallop  Abdomen: soft, non-tender; bowel sounds normal; no masses,  no organomegaly        Assessment:    Normal postpartum exam. Pap smear not done at today's visit.   Plan:   Essential components of care per ACOG recommendations:  1.  Mood and well being: Patient with negative depression screening today. Reviewed local resources for support.  - Patient does not use tobacco.   2. Infant care and feeding:  -Patient currently breastmilk feeding? Yes  -Social determinants of health (SDOH) reviewed in EPIC. No concerns  3. Sexuality, contraception and birth spacing - Patient does not want a pregnancy in the next year.  - Reviewed forms of contraception in tiered fashion. Patient desired condoms today.   - Discussed birth spacing of 18 months  4. Sleep and  fatigue -Encouraged family/partner/community support of 4 hrs of uninterrupted sleep to help with mood and fatigue  5. Physical Recovery  - Discussed patients delivery and complications - Patient had a 1st degree laceration, perineal healing reviewed. Patient expressed understanding - Patient has urinary incontinence? No  - Patient is safe to resume physical and sexual activity  6.  Health Maintenance - Last pap smear done within last year and was normal with negative HPV.   , DO [facility], [facility]

## 2021-01-23 IMAGING — US US MFM OB FOLLOW-UP
1 series · 13 of 28 positions shown · non-contrast
Comparison: none

[Series 1: us mfm ob follow-up · 39 acquisitions, 13 frames shown]
[im 2/39]
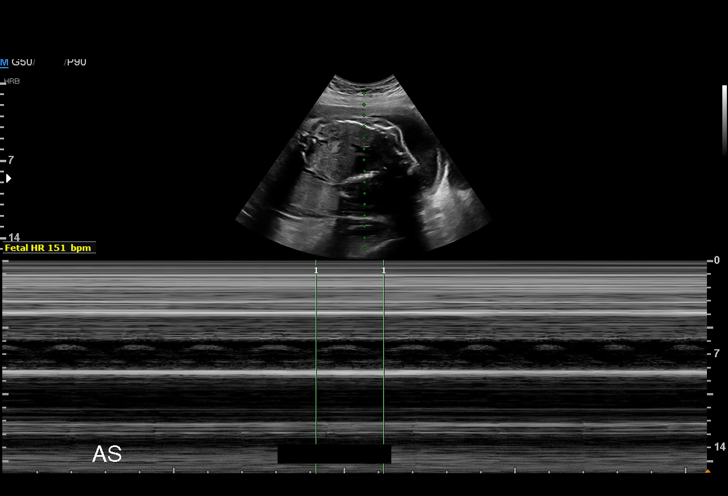
[im 5/39]
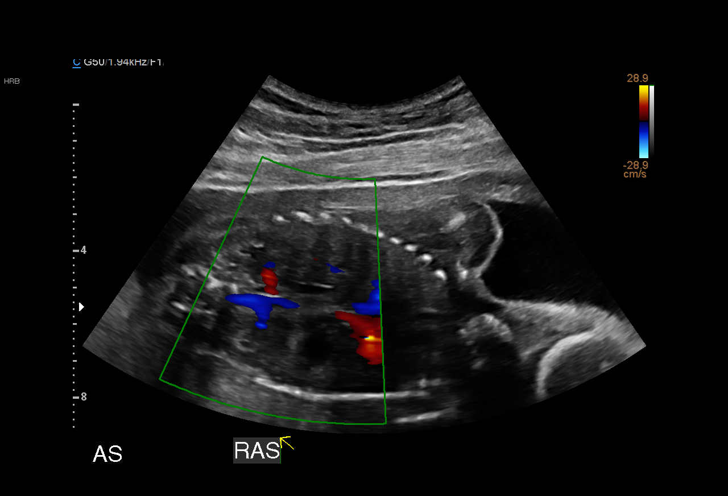
[im 8/39]
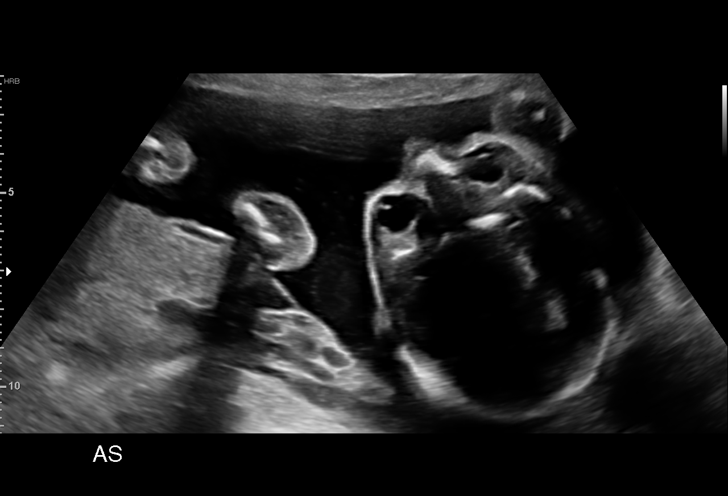
[im 10/39]
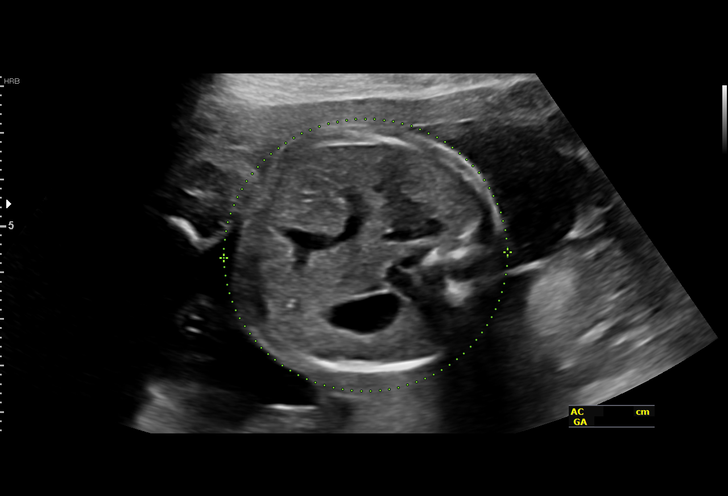
[im 13/39]
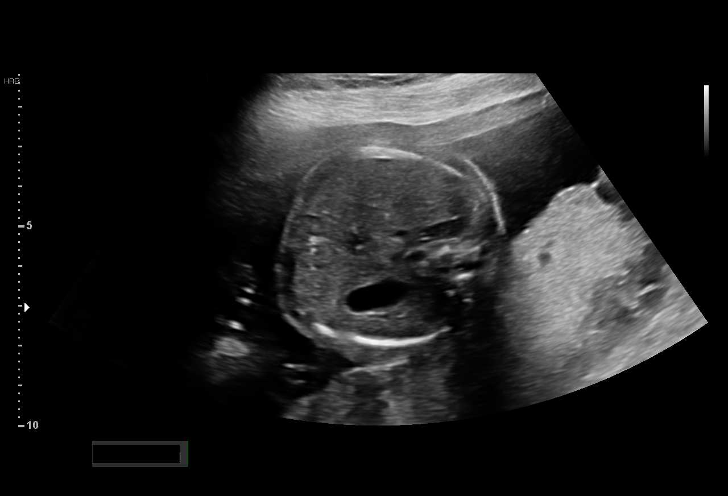
[im 16/39]
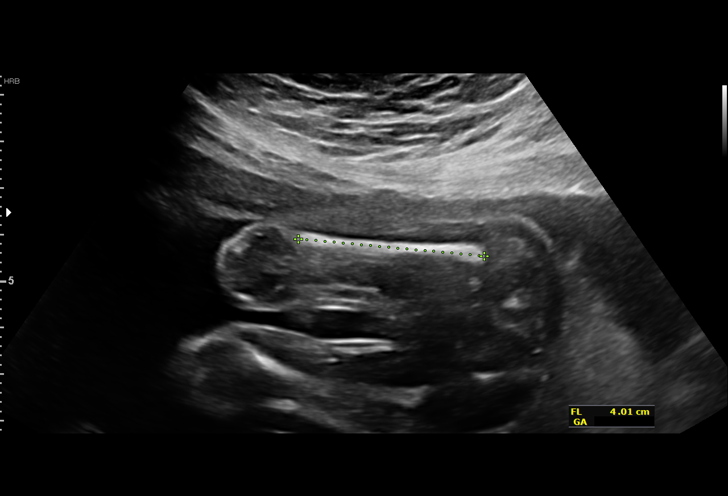
[im 20/39]
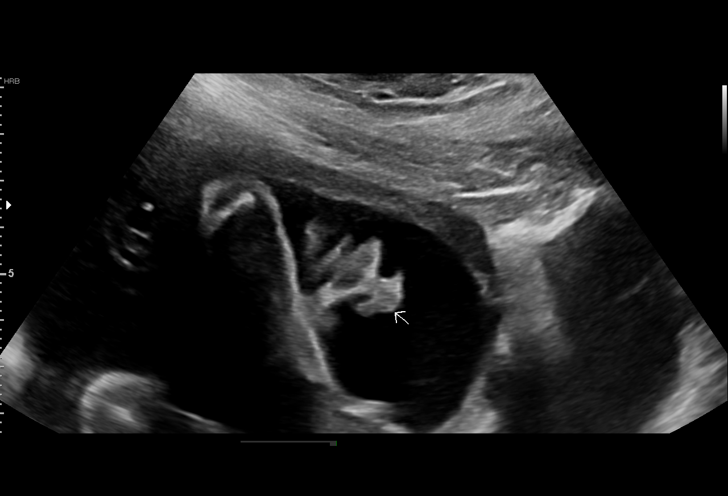
[im 23/39]
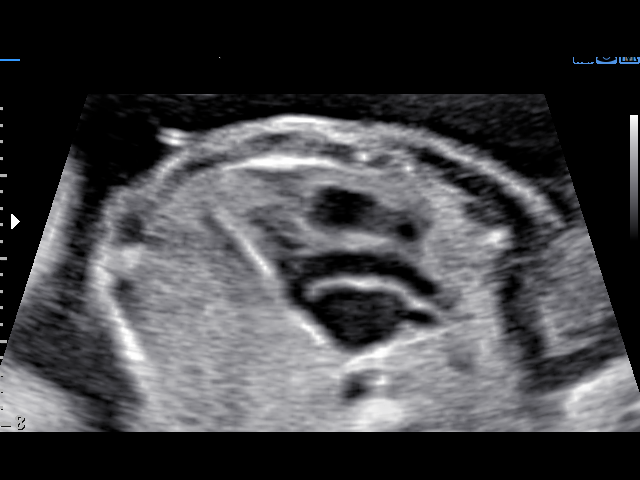
[im 26/39]
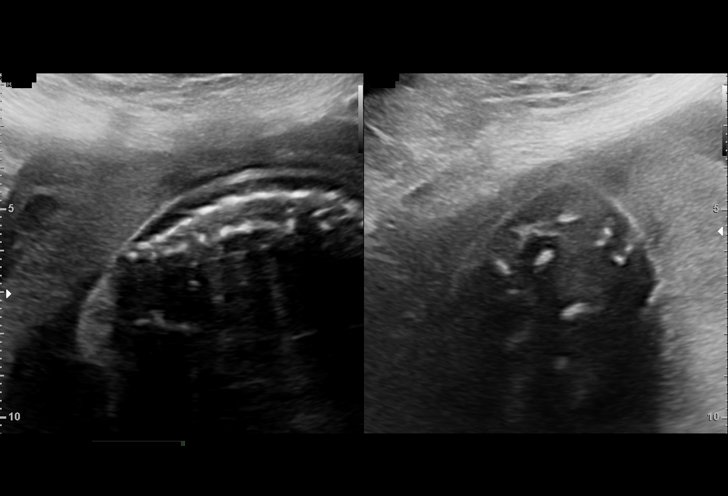
[im 29/39]
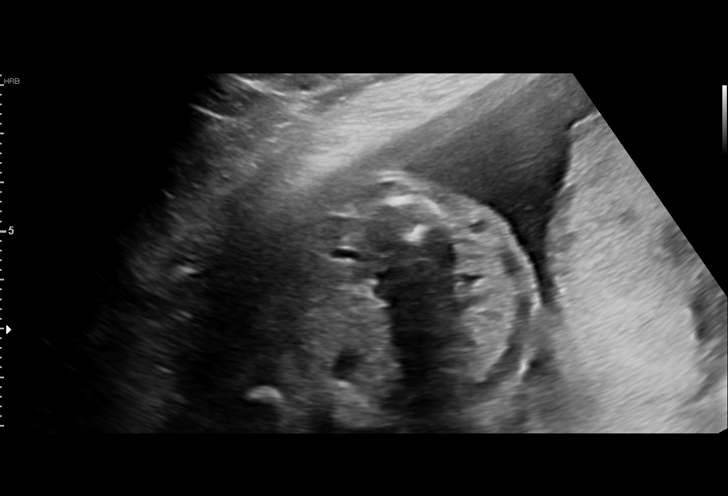
[im 31/39]
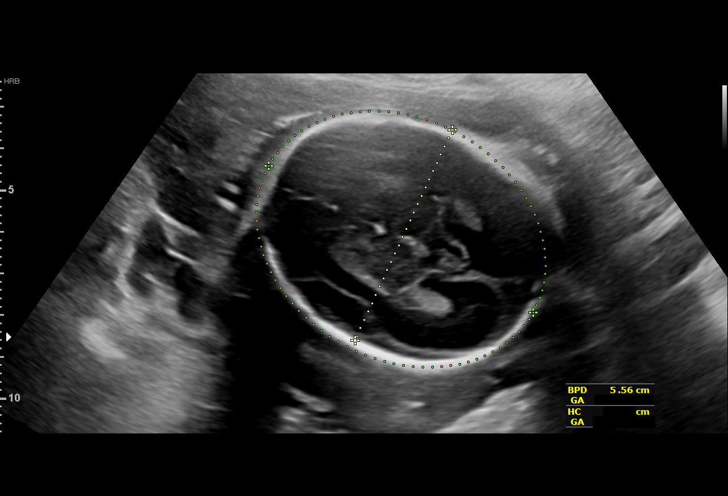
[im 34/39]
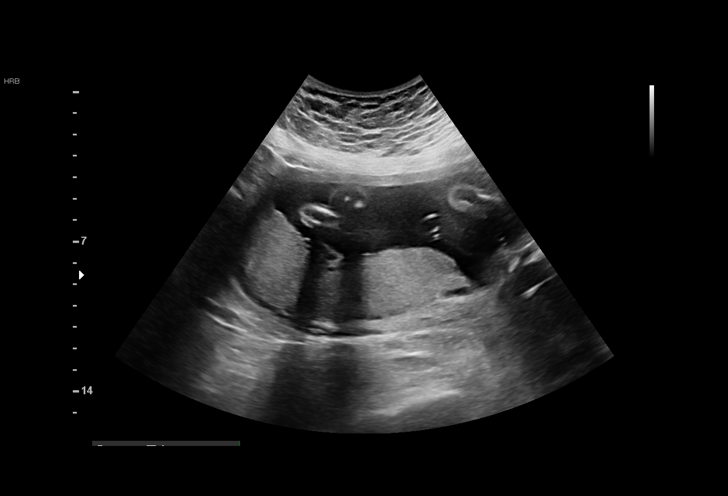
[im 37/39]
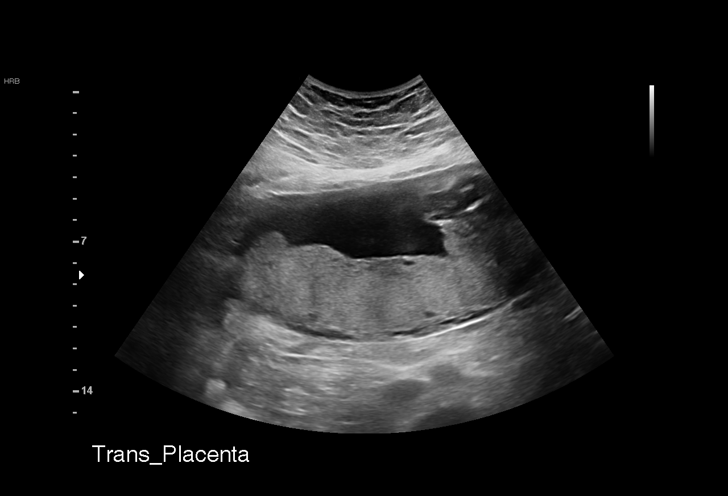

[13 of 28 positions shown; findings below may reference images not displayed]

KENDALL

 ----------------------------------------------------------------------

 ----------------------------------------------------------------------
Indications

  Obesity complicating pregnancy, second
  trimester
  Encounter for other antenatal screening
  follow-up
  23 weeks gestation of pregnancy
 ----------------------------------------------------------------------
Vital Signs

 BMI:
Fetal Evaluation

 Num Of Fetuses:         1
 Fetal Heart Rate(bpm):  151
 Cardiac Activity:       Observed
 Presentation:           Cephalic
 Placenta:               Posterior
 P. Cord Insertion:      Previously Visualized

 Amniotic Fluid
 AFI FV:      Within normal limits

                             Largest Pocket(cm)

Biometry

 BPD:        56  mm     G. Age:  23w 1d         49  %    CI:        72.73   %    70 - 86
                                                         FL/HC:      19.8   %    19.2 -
 HC:      208.8  mm     G. Age:  23w 0d         34  %    HC/AC:      1.13        1.05 -
 AC:      185.5  mm     G. Age:  23w 2d         53  %    FL/BPD:     73.8   %    71 - 87
 FL:       41.3  mm     G. Age:  23w 3d         52  %    FL/AC:      22.3   %    20 - 24
 HUM:      38.7  mm     G. Age:  23w 5d         58  %

 LV:        4.8  mm

 Est. FW:     583  gm      1 lb 5 oz     58  %
OB History

 Gravidity:    1         Term:   0
 Living:       0
Gestational Age

 LMP:           23w 0d        Date:  09/18/18                 EDD:   06/25/19
 U/S Today:     23w 2d                                        EDD:   06/23/19
 Best:          23w 0d     Det. By:  LMP  (09/18/18)          EDD:   06/25/19
Anatomy

 Cranium:               Appears normal         Aortic Arch:            Previously seen
 Cavum:                 Appears normal         Ductal Arch:            Previously seen
 Ventricles:            Appears normal         Diaphragm:              Appears normal
 Choroid Plexus:        Previously seen        Stomach:                Appears normal, left
                                                                       sided
 Cerebellum:            Previously seen        Abdomen:                Appears normal
 Posterior Fossa:       Previously seen        Abdominal Wall:         Previously seen
 Nuchal Fold:           Previously seen        Cord Vessels:           Previously seen
 Face:                  Profile nl; orbits     Kidneys:                Appear normal
                        prev vis
 Lips:                  Appears normal         Bladder:                Appears normal
 Thoracic:              Appears normal         Spine:                  Ltd views no
                                                                       intracranial signs of
                                                                       NTD
 Heart:                 Previously seen        Upper Extremities:      Previously seen
 RVOT:                  Appears normal         Lower Extremities:      Previously seen
 LVOT:                  Appears normal

 Other:  Technically difficult due to maternal habitus and fetal position.
Cervix Uterus Adnexa

 Cervix
 Not visualized (advanced GA >00wks)
Comments

 This patient was seen for a follow up exam as the views of
 the fetal anatomy were unable to be fully visualized during
 her last exam.  She denies any problems since her last exam.
 The fetal growth and amniotic fluid level appears appropriate
 for her gestational age.
 The views of the fetal anatomy were visualized today.  There
 were no obvious anomalies noted.
 The limitations of ultrasound in the detection of all anomalies
 was discussed.
 Follow-up as indicated.

## 2022-02-15 DIAGNOSIS — N63 Unspecified lump in unspecified breast: Secondary | ICD-10-CM | POA: Diagnosis not present

## 2022-02-15 DIAGNOSIS — E785 Hyperlipidemia, unspecified: Secondary | ICD-10-CM | POA: Diagnosis not present

## 2022-02-15 DIAGNOSIS — R7303 Prediabetes: Secondary | ICD-10-CM | POA: Insufficient documentation

## 2022-02-15 DIAGNOSIS — N644 Mastodynia: Secondary | ICD-10-CM | POA: Diagnosis not present

## 2022-02-15 DIAGNOSIS — N6489 Other specified disorders of breast: Secondary | ICD-10-CM | POA: Diagnosis not present

## 2022-02-15 DIAGNOSIS — Z0000 Encounter for general adult medical examination without abnormal findings: Secondary | ICD-10-CM | POA: Diagnosis not present

## 2022-02-19 DIAGNOSIS — N644 Mastodynia: Secondary | ICD-10-CM | POA: Diagnosis not present

## 2022-07-31 ENCOUNTER — Ambulatory Visit (INDEPENDENT_AMBULATORY_CARE_PROVIDER_SITE_OTHER): Payer: Federal, State, Local not specified - PPO | Admitting: Family Medicine

## 2022-07-31 ENCOUNTER — Encounter: Payer: Self-pay | Admitting: Family Medicine

## 2022-07-31 ENCOUNTER — Other Ambulatory Visit (HOSPITAL_COMMUNITY)
Admission: RE | Admit: 2022-07-31 | Discharge: 2022-07-31 | Disposition: A | Discharge status: Home / Self Care | Payer: Federal, State, Local not specified - PPO | Source: Ambulatory Visit | Attending: Family Medicine | Admitting: Family Medicine

## 2022-07-31 ENCOUNTER — Other Ambulatory Visit (INDEPENDENT_AMBULATORY_CARE_PROVIDER_SITE_OTHER): Payer: Federal, State, Local not specified - PPO

## 2022-07-31 VITALS — BP 123/72 | HR 173 | Wt 232.0 lb

## 2022-07-31 DIAGNOSIS — Z6839 Body mass index (BMI) 39.0-39.9, adult: Secondary | ICD-10-CM | POA: Diagnosis not present

## 2022-07-31 DIAGNOSIS — Z3481 Encounter for supervision of other normal pregnancy, first trimester: Secondary | ICD-10-CM

## 2022-07-31 DIAGNOSIS — O3680X0 Pregnancy with inconclusive fetal viability, not applicable or unspecified: Secondary | ICD-10-CM

## 2022-07-31 DIAGNOSIS — Z3A09 9 weeks gestation of pregnancy: Secondary | ICD-10-CM

## 2022-07-31 DIAGNOSIS — O0990 Supervision of high risk pregnancy, unspecified, unspecified trimester: Secondary | ICD-10-CM | POA: Diagnosis not present

## 2022-07-31 DIAGNOSIS — O99810 Abnormal glucose complicating pregnancy: Secondary | ICD-10-CM

## 2022-07-31 DIAGNOSIS — Z3A10 10 weeks gestation of pregnancy: Secondary | ICD-10-CM

## 2022-07-31 MED ORDER — ASPIRIN 81 MG PO TBEC: 81.0000 mg | DELAYED_RELEASE_TABLET | Freq: Every day | ORAL | 2 refills | Status: DC

## 2022-07-31 NOTE — Progress Notes (Signed)
Subjective:  Crystal Bradford is a G2P1001 [redacted]w[redacted]d being seen today for her first obstetrical visit.  Her obstetrical history is significant for  normal SVD . Patient does intend to breast feed. Pregnancy history fully reviewed.  Patient reports headache.  BP 123/72   Pulse (!) 123   Wt 232 lb (105.2 kg)   LMP 05/06/2022   BMI 38.61 kg/m   HISTORY: OB History  Gravida Para Term Preterm AB Living  2 1 1     1   SAB IAB Ectopic Multiple Live Births        0 1    # Outcome Date GA Lbr Len/2nd Weight Sex Type Anes PTL Lv  2 Current           1 Term 06/18/19 [redacted]w[redacted]d 19:30 / 01:07 8 lb 5.7 oz (3.79 kg) M Vag-Spont None  LIV     Birth Comments: WNL    Past Medical History:  Diagnosis Date   Medical history non-contributory     Past Surgical History:  Procedure Laterality Date   NO PAST SURGERIES      Family History  Problem Relation Age of Onset   Hypertension Mother    Diabetes Mother    Arthritis Mother      Exam  BP 123/72   Pulse (!) 123   Wt 232 lb (105.2 kg)   LMP 05/06/2022   BMI 38.61 kg/m   Chaperone present during exam  CONSTITUTIONAL: Well-developed, well-nourished female in no acute distress.  HENT:  Normocephalic, atraumatic, External right and left ear normal. Oropharynx is clear and moist EYES: Conjunctivae and EOM are normal. Pupils are equal, round, and reactive to light. No scleral icterus.  NECK: Normal range of motion, supple, no masses.  Normal thyroid.  CARDIOVASCULAR: Normal heart rate noted, regular rhythm RESPIRATORY: Clear to auscultation bilaterally. Effort and breath sounds normal, no problems with respiration noted. BREASTS: Symmetric in size. No masses, skin changes, nipple drainage, or lymphadenopathy. ABDOMEN: Soft, normal bowel sounds, no distention noted.  No tenderness, rebound or guarding.  PELVIC: Normal appearing external genitalia. MUSCULOSKELETAL: Normal range of motion. No tenderness.  No cyanosis, clubbing, or edema.  2+  distal pulses. SKIN: Skin is warm and dry. No rash noted. Not diaphoretic. No erythema. No pallor. NEUROLOGIC: Alert and oriented to person, place, and time. Normal reflexes, muscle tone coordination. No cranial nerve deficit noted. PSYCHIATRIC: Normal mood and affect. Normal behavior. Normal judgment and thought content.    Assessment:    Pregnancy: G2P1001 Patient Active Problem List   Diagnosis Date Noted   Supervision of high risk pregnancy, antepartum 07/31/2022      Plan:   1. BMI 39.0-39.9,adult HgA1c ASA 81mg   2. Supervision of high risk pregnancy, antepartum PAtient thinks she had PAP in January. Will try to get results. If not, PAP next appt.  3. Pregnancy with uncertain fetal viability, single or unspecified fetus - US OB Limited; Future    Initial labs obtained Continue prenatal vitamins Reviewed n/v relief measures and warning s/s to report Reviewed recommended weight gain based on pre-gravid BMI Encouraged well-balanced diet Genetic & carrier screening discussed: requests Panorama,  Ultrasound discussed; fetal survey: requested CCNC completed> form faxed if has or is planning to apply for medicaid The nature of [facility] [employer] with multiple MDs and other Advanced Practice Providers was explained to patient; also emphasized that fellows, residents, and students are part of our team.     Problem list reviewed and  updated. 75% of 30 min visit spent on counseling and coordination of care.     Levie Heritage 07/31/2022

## 2022-08-01 DIAGNOSIS — O99810 Abnormal glucose complicating pregnancy: Secondary | ICD-10-CM | POA: Insufficient documentation

## 2022-08-01 LAB — CBC/D/PLT+RPR+RH+ABO+RUBIGG...
Antibody Screen: NEGATIVE
Basophils Absolute: 0 10*3/uL (ref 0.0–0.2)
Basos: 0 %
EOS (ABSOLUTE): 0.1 10*3/uL (ref 0.0–0.4)
Eos: 2 %
HCV Ab: NONREACTIVE
HIV Screen 4th Generation wRfx: NONREACTIVE
Hematocrit: 38.4 % (ref 34.0–46.6)
Hemoglobin: 12.8 g/dL (ref 11.1–15.9)
Hepatitis B Surface Ag: NEGATIVE
Immature Grans (Abs): 0 10*3/uL (ref 0.0–0.1)
Immature Granulocytes: 0 %
Lymphocytes Absolute: 2 10*3/uL (ref 0.7–3.1)
Lymphs: 24 %
MCH: 27.4 pg (ref 26.6–33.0)
MCHC: 33.3 g/dL (ref 31.5–35.7)
MCV: 82 fL (ref 79–97)
Monocytes Absolute: 0.6 10*3/uL (ref 0.1–0.9)
Monocytes: 7 %
Neutrophils Absolute: 5.5 10*3/uL (ref 1.4–7.0)
Neutrophils: 67 %
Platelets: 291 10*3/uL (ref 150–450)
RBC: 4.68 x10E6/uL (ref 3.77–5.28)
RDW: 13.8 % (ref 11.7–15.4)
RPR Ser Ql: NONREACTIVE
Rh Factor: POSITIVE
Rubella Antibodies, IGG: 3.42 index (ref >0.99)
WBC: 8.2 10*3/uL (ref 3.4–10.8)

## 2022-08-01 LAB — HEMOGLOBIN A1C
Est. average glucose Bld gHb Est-mCnc: 120 mg/dL
Hgb A1c MFr Bld: 5.8 % — ABNORMAL HIGH (ref 4.8–5.6)

## 2022-08-01 LAB — HCV INTERPRETATION

## 2022-08-02 ENCOUNTER — Ambulatory Visit: Payer: Federal, State, Local not specified - PPO

## 2022-08-02 LAB — CULTURE, OB URINE

## 2022-08-02 LAB — GC/CHLAMYDIA PROBE AMP (~~LOC~~) NOT AT ARMC
Chlamydia: NEGATIVE
Comment: NEGATIVE
Comment: NORMAL
Neisseria Gonorrhea: NEGATIVE

## 2022-08-02 LAB — URINE CULTURE, OB REFLEX

## 2022-08-07 ENCOUNTER — Ambulatory Visit (INDEPENDENT_AMBULATORY_CARE_PROVIDER_SITE_OTHER): Payer: Federal, State, Local not specified - PPO

## 2022-08-07 DIAGNOSIS — O99810 Abnormal glucose complicating pregnancy: Secondary | ICD-10-CM

## 2022-08-07 DIAGNOSIS — O0990 Supervision of high risk pregnancy, unspecified, unspecified trimester: Secondary | ICD-10-CM

## 2022-08-07 NOTE — Progress Notes (Signed)
Patient sent to lab. [facility]

## 2022-08-08 LAB — GLUCOSE TOLERANCE, 2 HOURS W/ 1HR
Glucose, 1 hour: 116 mg/dL (ref 70–179)
Glucose, 2 hour: 99 mg/dL (ref 70–152)
Glucose, Fasting: 89 mg/dL (ref 70–91)

## 2022-08-22 ENCOUNTER — Other Ambulatory Visit (HOSPITAL_COMMUNITY)
Admission: RE | Admit: 2022-08-22 | Discharge: 2022-08-22 | Disposition: A | Discharge status: Home / Self Care | Payer: Federal, State, Local not specified - PPO | Source: Ambulatory Visit | Attending: Obstetrics and Gynecology | Admitting: Obstetrics and Gynecology

## 2022-08-22 ENCOUNTER — Ambulatory Visit (INDEPENDENT_AMBULATORY_CARE_PROVIDER_SITE_OTHER): Payer: Federal, State, Local not specified - PPO | Admitting: Obstetrics and Gynecology

## 2022-08-22 VITALS — BP 132/75 | HR 100 | Wt 228.0 lb

## 2022-08-22 DIAGNOSIS — O0990 Supervision of high risk pregnancy, unspecified, unspecified trimester: Secondary | ICD-10-CM | POA: Insufficient documentation

## 2022-08-22 DIAGNOSIS — Z3A13 13 weeks gestation of pregnancy: Secondary | ICD-10-CM

## 2022-08-22 DIAGNOSIS — O219 Vomiting of pregnancy, unspecified: Secondary | ICD-10-CM

## 2022-08-22 DIAGNOSIS — O0991 Supervision of high risk pregnancy, unspecified, first trimester: Secondary | ICD-10-CM | POA: Diagnosis not present

## 2022-08-22 DIAGNOSIS — O99810 Abnormal glucose complicating pregnancy: Secondary | ICD-10-CM

## 2022-08-22 NOTE — Addendum Note (Signed)
Addended by: Milas Hock A on: 08/22/2022 04:09 PM   Modules accepted: Orders

## 2022-08-22 NOTE — Progress Notes (Signed)
   PRENATAL VISIT NOTE  Subjective:  Crystal Bradford is a 32 y.o. G2P1001 at [redacted]w[redacted]d being seen today for ongoing prenatal care.  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  Patient reports nausea.  Contractions: Not present. Vag. Bleeding: None.   . Denies leaking of fluid.   The following portions of the patient's history were reviewed and updated as appropriate: allergies, current medications, past family history, past medical history, past social history, past surgical history and problem list.   Objective:   Vitals:   08/22/22 1328  BP: 132/75  Pulse: 100  Weight: 228 lb (103.4 kg)    Fetal Status:           General:  Alert, oriented and cooperative. Patient is in no acute distress.  Skin: Skin is warm and dry. No rash noted.   Cardiovascular: Normal heart rate noted  Respiratory: Normal respiratory effort, no problems with respiration noted  Abdomen: Soft, gravid, appropriate for gestational age.  Pain/Pressure: Present ("cramping")     Pelvic: Cervical exam deferred        Extremities: Normal range of motion.  Edema: None  Mental Status: Normal mood and affect. Normal behavior. Normal judgment and thought content.   Assessment and Plan:  Pregnancy: G2P1001 at [redacted]w[redacted]d 1. Supervision of high risk pregnancy, antepartum Offered pap today since none on file. Pt accepts. MSAFP next visit. Has anatomy US scheduled.   2. 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 Nausea/vomiting in pregnancy She would like to try tums first. If that doesn't work she will let us know and we can send in Reglan.   4. Pregnancy with 13 completed weeks gestation  Preterm labor symptoms and general obstetric precautions including but not limited to vaginal bleeding, contractions, leaking of fluid and fetal movement  were reviewed in detail with the patient. Please refer to After Visit Summary for other counseling recommendations.   No follow-ups on file.  Future Appointments  Date Time Provider Department Center  08/22/2022  2:30 PM Milas Hock, MD [facility] None  09/26/2022  8:15 AM Lorriane Shire, MD [facility] None  10/11/2022  1:15 PM [facility] NURSE [facility]  10/11/2022  1:30 PM [facility] US3 [facility]    Milas Hock, MD

## 2022-08-22 NOTE — Progress Notes (Signed)
Patient states she has had some recent nausea and vomiting. Armandina Stammer RN

## 2022-08-24 ENCOUNTER — Encounter: Payer: Self-pay | Admitting: Obstetrics and Gynecology

## 2022-09-09 ENCOUNTER — Other Ambulatory Visit: Payer: Self-pay

## 2022-09-09 DIAGNOSIS — O219 Vomiting of pregnancy, unspecified: Secondary | ICD-10-CM

## 2022-09-09 MED ORDER — PROMETHAZINE HCL 25 MG PO TABS: 25.0000 mg | ORAL_TABLET | Freq: Four times a day (QID) | ORAL | 0 refills | Status: DC | PRN

## 2022-09-09 NOTE — Progress Notes (Signed)
Patient called requesting a Rx for nausea and vomiting. Phenergan 25 mg PO q6h was sent to her pharmacy. Robertt Buda l Jaliza Seifried, CMA

## 2022-09-26 ENCOUNTER — Ambulatory Visit (INDEPENDENT_AMBULATORY_CARE_PROVIDER_SITE_OTHER): Payer: Federal, State, Local not specified - PPO | Admitting: Obstetrics and Gynecology

## 2022-09-26 VITALS — BP 128/80 | HR 104 | Wt 229.0 lb

## 2022-09-26 DIAGNOSIS — O0990 Supervision of high risk pregnancy, unspecified, unspecified trimester: Secondary | ICD-10-CM

## 2022-09-26 DIAGNOSIS — Z3A18 18 weeks gestation of pregnancy: Secondary | ICD-10-CM | POA: Diagnosis not present

## 2022-09-26 DIAGNOSIS — Z3492 Encounter for supervision of normal pregnancy, unspecified, second trimester: Secondary | ICD-10-CM | POA: Diagnosis not present

## 2022-09-26 NOTE — Progress Notes (Signed)
   PRENATAL VISIT NOTE  Subjective:  Crystal Bradford is a 32 y.o. G2P1001 at [redacted]w[redacted]d being seen today for ongoing prenatal care.  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  Patient reports headache.  Contractions: Not present. Vag. Bleeding: None.  Movement: Present. Denies leaking of fluid.   Nausea improved. Has not been able to eat much. Eat a small amount of food every 2 hours and has been having more frequent HA. Using tylenol occasionally but scared to use tylenol due to various things she has seen online  The following portions of the patient's history were reviewed and updated as appropriate: allergies, current medications, past family history, past medical history, past social history, past surgical history and problem list.   Objective:   Vitals:   09/26/22 0824  BP: 128/80  Pulse: (!) 104  Weight: 229 lb (103.9 kg)    Fetal Status:     Movement: Present     General:  Alert, oriented and cooperative. Patient is in no acute distress.  Skin: Skin is warm and dry. No rash noted.   Cardiovascular: Normal heart rate noted  Respiratory: Normal respiratory effort, no problems with respiration noted  Abdomen: Soft, gravid, appropriate for gestational age.  Pain/Pressure: Present     Pelvic: Cervical exam deferred        Extremities: Normal range of motion.  Edema: None  Mental Status: Normal mood and affect. Normal behavior. Normal judgment and thought content.   Assessment and Plan:  Pregnancy: G2P1001 at [redacted]w[redacted]d 1. [redacted] weeks gestation of pregnancy FHR wnl  Occasional HA- recommend APAP prn and including more complex carbs and protein in diet   2. Supervision of high risk pregnancy, antepartum Note given for travel  AFP, Horizon, panorama today  [place] 9/27  Preterm labor symptoms and general obstetric precautions including  but not limited to vaginal bleeding, contractions, leaking of fluid and fetal movement were reviewed in detail with the patient. Please refer to After Visit Summary for other counseling recommendations.   No follow-ups on file.  Future Appointments  Date Time Provider Department Center  10/11/2022  1:15 PM [facility] NURSE [facility]  10/11/2022  1:30 PM [facility] US3 [facility]    Lorriane Shire, MD

## 2022-09-28 LAB — AFP, SERUM, OPEN SPINA BIFIDA
AFP MoM: 0.31
AFP Value: 19.4 ng/mL
Gest. Age on Collection Date: 22 wk
Maternal Age At EDD: 31.8 a
OSBR Risk 1 IN: 10000
Test Results:: NEGATIVE
Weight: 228 [lb_av]

## 2022-10-03 LAB — PANORAMA PRENATAL TEST FULL PANEL:PANORAMA TEST PLUS 5 ADDITIONAL MICRODELETIONS: FETAL FRACTION: 5

## 2022-10-05 LAB — HORIZON CUSTOM: REPORT SUMMARY: NEGATIVE

## 2022-10-07 DIAGNOSIS — O99210 Obesity complicating pregnancy, unspecified trimester: Secondary | ICD-10-CM | POA: Insufficient documentation

## 2022-10-11 ENCOUNTER — Other Ambulatory Visit: Payer: Self-pay | Admitting: *Deleted

## 2022-10-11 ENCOUNTER — Ambulatory Visit: Payer: Federal, State, Local not specified - PPO | Attending: Family Medicine

## 2022-10-11 ENCOUNTER — Ambulatory Visit: Payer: Federal, State, Local not specified - PPO | Admitting: *Deleted

## 2022-10-11 VITALS — BP 121/62 | HR 97

## 2022-10-11 DIAGNOSIS — O99210 Obesity complicating pregnancy, unspecified trimester: Secondary | ICD-10-CM | POA: Diagnosis not present

## 2022-10-11 DIAGNOSIS — O99810 Abnormal glucose complicating pregnancy: Secondary | ICD-10-CM | POA: Diagnosis not present

## 2022-10-11 DIAGNOSIS — E669 Obesity, unspecified: Secondary | ICD-10-CM | POA: Diagnosis not present

## 2022-10-11 DIAGNOSIS — O99212 Obesity complicating pregnancy, second trimester: Secondary | ICD-10-CM | POA: Diagnosis not present

## 2022-10-11 DIAGNOSIS — O0990 Supervision of high risk pregnancy, unspecified, unspecified trimester: Secondary | ICD-10-CM

## 2022-10-11 DIAGNOSIS — Z3A20 20 weeks gestation of pregnancy: Secondary | ICD-10-CM | POA: Diagnosis not present

## 2022-10-21 DIAGNOSIS — M5489 Other dorsalgia: Secondary | ICD-10-CM | POA: Diagnosis not present

## 2022-10-23 ENCOUNTER — Ambulatory Visit (INDEPENDENT_AMBULATORY_CARE_PROVIDER_SITE_OTHER): Payer: Federal, State, Local not specified - PPO | Admitting: Medical

## 2022-10-23 ENCOUNTER — Encounter: Payer: Self-pay | Admitting: Medical

## 2022-10-23 VITALS — BP 120/69 | HR 116 | Wt 231.0 lb

## 2022-10-23 DIAGNOSIS — Z3A22 22 weeks gestation of pregnancy: Secondary | ICD-10-CM

## 2022-10-23 DIAGNOSIS — O0990 Supervision of high risk pregnancy, unspecified, unspecified trimester: Secondary | ICD-10-CM

## 2022-10-23 DIAGNOSIS — O99810 Abnormal glucose complicating pregnancy: Secondary | ICD-10-CM

## 2022-10-23 DIAGNOSIS — N949 Unspecified condition associated with female genital organs and menstrual cycle: Secondary | ICD-10-CM

## 2022-10-23 DIAGNOSIS — O99212 Obesity complicating pregnancy, second trimester: Secondary | ICD-10-CM

## 2022-10-23 NOTE — Progress Notes (Signed)
   PRENATAL VISIT NOTE  Subjective:  Crystal Bradford is a 32 y.o. G2P1001 at [redacted]w[redacted]d being seen today for ongoing prenatal care.  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  Patient reports  mild edema of hands and feet .  Contractions: Not present. Vag. Bleeding: None.  Movement: Present. Denies leaking of fluid.   The following portions of the patient's history were reviewed and updated as appropriate: allergies, current medications, past family history, past medical history, past social history, past surgical history and problem list.   Objective:   Vitals:   10/23/22 1026  BP: 120/69  Pulse: (!) 116  Weight: 231 lb (104.8 kg)    Fetal Status: Fetal Heart Rate (bpm): 153   Movement: Present     General:  Alert, oriented and cooperative. Patient is in no acute distress.  Skin: Skin is warm and dry. No rash noted.   Cardiovascular: Normal heart rate noted  Respiratory: Normal respiratory effort, no problems with respiration noted  Abdomen: Soft, gravid, appropriate for gestational age.  Pain/Pressure: Present     Pelvic: Cervical exam deferred        Extremities: Normal range of motion.  Edema: Trace  Mental Status: Normal mood and affect. Normal behavior. Normal judgment and thought content.   Assessment and Plan:  Pregnancy: G2P1001 at [redacted]w[redacted]d 1. Supervision of high risk pregnancy, antepartum - Trace swelling noted intermittently, discussed increased PO hydration, elevation of lower extremities   2. 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 Obesity affecting pregnancy in second trimester, unspecified obesity type - Discussed weight gain recommendations for pregnancy in response to patient questions   4. Round ligament pain - Discussed pregnancy belt   5. [redacted]  weeks gestation of pregnancy  Preterm labor symptoms and general obstetric precautions including but not limited to vaginal bleeding, contractions, leaking of fluid and fetal movement were reviewed in detail with the patient. Please refer to After Visit Summary for other counseling recommendations.   Return in about 4 weeks (around 11/20/2022) for LOB, any provider, In-Person.  Future Appointments  Date Time Provider Department Center  11/20/2022  9:35 AM Levie Heritage, DO [facility] None  11/22/2022  8:30 AM [facility] US1 [facility]    Vonzella Nipple, PA-C

## 2022-10-25 ENCOUNTER — Encounter: Payer: Federal, State, Local not specified - PPO | Admitting: Obstetrics and Gynecology

## 2022-11-20 ENCOUNTER — Ambulatory Visit: Payer: Federal, State, Local not specified - PPO | Admitting: Family Medicine

## 2022-11-20 VITALS — BP 123/62 | HR 89 | Wt 235.0 lb

## 2022-11-20 DIAGNOSIS — O0990 Supervision of high risk pregnancy, unspecified, unspecified trimester: Secondary | ICD-10-CM

## 2022-11-20 DIAGNOSIS — O99212 Obesity complicating pregnancy, second trimester: Secondary | ICD-10-CM

## 2022-11-20 DIAGNOSIS — O99810 Abnormal glucose complicating pregnancy: Secondary | ICD-10-CM

## 2022-11-20 NOTE — Progress Notes (Signed)
   PRENATAL VISIT NOTE  Subjective:  Crystal Bradford is a 32 y.o. G2P1001 at [redacted]w[redacted]d being seen today for ongoing prenatal care.  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  Patient reports  constipation .  Contractions: Not present. Vag. Bleeding: None.  Movement: Present. Denies leaking of fluid.   The following portions of the patient's history were reviewed and updated as appropriate: allergies, current medications, past family history, past medical history, past social history, past surgical history and problem list.   Objective:   Vitals:   11/20/22 0935  BP: 123/62  Pulse: 89  Weight: 235 lb (106.6 kg)    Fetal Status: Fetal Heart Rate (bpm): 156   Movement: Present     General:  Alert, oriented and cooperative. Patient is in no acute distress.  Skin: Skin is warm and dry. No rash noted.   Cardiovascular: Normal heart rate noted  Respiratory: Normal respiratory effort, no problems with respiration noted  Abdomen: Soft, gravid, appropriate for gestational age.  Pain/Pressure: Absent     Pelvic: Cervical exam deferred        Extremities: Normal range of motion.  Edema: None  Mental Status: Normal mood and affect. Normal behavior. Normal judgment and thought content.   Assessment and Plan:  Pregnancy: G2P1001 at [redacted]w[redacted]d 1. Supervision of high risk pregnancy, antepartum FHT normal  2. 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 Obesity affecting pregnancy in second trimester, unspecified obesity type  Preterm labor symptoms and general obstetric precautions including but not limited to vaginal bleeding, contractions, leaking of fluid and fetal movement were reviewed in detail with the patient. Please refer to After Visit Summary for other counseling recommendations.   No  follow-ups on file.  Future Appointments  Date Time Provider Department Center  11/22/2022  8:30 AM [facility] US1 [facility]  12/04/2022  9:55 AM Levie Heritage, DO [facility] None  12/18/2022  9:15 AM Levie Heritage, DO [facility] None  01/01/2023  9:35 AM Adrian Blackwater Rhona Raider, DO [facility] None    Levie Heritage, DO

## 2022-11-22 ENCOUNTER — Other Ambulatory Visit: Payer: Self-pay | Admitting: *Deleted

## 2022-11-22 ENCOUNTER — Other Ambulatory Visit: Payer: Self-pay

## 2022-11-22 ENCOUNTER — Ambulatory Visit: Payer: Federal, State, Local not specified - PPO | Attending: Obstetrics

## 2022-11-22 DIAGNOSIS — O99212 Obesity complicating pregnancy, second trimester: Secondary | ICD-10-CM | POA: Insufficient documentation

## 2022-11-22 DIAGNOSIS — O99210 Obesity complicating pregnancy, unspecified trimester: Secondary | ICD-10-CM

## 2022-11-22 DIAGNOSIS — O0990 Supervision of high risk pregnancy, unspecified, unspecified trimester: Secondary | ICD-10-CM

## 2022-11-22 DIAGNOSIS — Z3A26 26 weeks gestation of pregnancy: Secondary | ICD-10-CM | POA: Diagnosis not present

## 2022-11-22 DIAGNOSIS — O99810 Abnormal glucose complicating pregnancy: Secondary | ICD-10-CM | POA: Diagnosis not present

## 2022-11-22 DIAGNOSIS — E669 Obesity, unspecified: Secondary | ICD-10-CM

## 2022-12-04 ENCOUNTER — Ambulatory Visit (INDEPENDENT_AMBULATORY_CARE_PROVIDER_SITE_OTHER): Payer: Federal, State, Local not specified - PPO | Admitting: Family Medicine

## 2022-12-04 VITALS — BP 122/65 | HR 111 | Wt 238.0 lb

## 2022-12-04 DIAGNOSIS — Z3A28 28 weeks gestation of pregnancy: Secondary | ICD-10-CM

## 2022-12-04 DIAGNOSIS — Z3493 Encounter for supervision of normal pregnancy, unspecified, third trimester: Secondary | ICD-10-CM | POA: Diagnosis not present

## 2022-12-04 DIAGNOSIS — O0990 Supervision of high risk pregnancy, unspecified, unspecified trimester: Secondary | ICD-10-CM

## 2022-12-04 DIAGNOSIS — O99212 Obesity complicating pregnancy, second trimester: Secondary | ICD-10-CM

## 2022-12-04 DIAGNOSIS — R7303 Prediabetes: Secondary | ICD-10-CM

## 2022-12-04 NOTE — Progress Notes (Signed)
   PRENATAL VISIT NOTE  Subjective:  Crystal Bradford is a 32 y.o. G2P1001 at [redacted]w[redacted]d being seen today for ongoing prenatal care.  She is currently monitored for the following issues for this high-risk pregnancy and has Supervision of high risk pregnancy, antepartum; Prediabetes; and Obesity affecting pregnancy on their problem list.  Patient reports no complaints.  Contractions: Not present. Vag. Bleeding: None.  Movement: (!) Decreased. Denies leaking of fluid.   The following portions of the patient's history were reviewed and updated as appropriate: allergies, current medications, past family history, past medical history, past social history, past surgical history and problem list.   Objective:   Vitals:   12/04/22 0955  BP: 122/65  Pulse: (!) 111  Weight: 238 lb (108 kg)    Fetal Status: Fetal Heart Rate (bpm): 156   Movement: (!) Decreased     General:  Alert, oriented and cooperative. Patient is in no acute distress.  Skin: Skin is warm and dry. No rash noted.   Cardiovascular: Normal heart rate noted  Respiratory: Normal respiratory effort, no problems with respiration noted  Abdomen: Soft, gravid, appropriate for gestational age.  Pain/Pressure: Present     Pelvic: Cervical exam deferred        Extremities: Normal range of motion.  Edema: Trace  Mental Status: Normal mood and affect. Normal behavior. Normal judgment and thought content.   Assessment and Plan:  Pregnancy: G2P1001 at [redacted]w[redacted]d 1. [redacted] weeks gestation of pregnancy - CBC - Glucose Tolerance, 2 Hours w/1 Hour - HIV Antibody (routine testing w rflx) - RPR  2. Supervision of high risk pregnancy, antepartum FHT normal  3. Prediabetes Early 2hr GTT normal  4. Obesity affecting pregnancy in second trimester, unspecified obesity type  Preterm labor symptoms and general obstetric precautions including but not limited to vaginal bleeding, contractions, leaking of fluid and fetal movement were reviewed in detail with  the patient. Please refer to After Visit Summary for other counseling recommendations.   No follow-ups on file.  Future Appointments  Date Time Provider Department Center  12/18/2022  9:15 AM Levie Heritage, DO [facility] None  01/01/2023  9:35 AM Levie Heritage, DO [facility] None  01/03/2023  3:15 PM [facility] NURSE [facility]  01/03/2023  3:30 PM [facility] US1 [facility]  01/14/2023  9:15 AM Federico Flake, MD [facility] None  01/29/2023  9:15 AM Levie Heritage, DO [facility] None  02/12/2023  9:15 AM Levie Heritage, DO [facility] None    Levie Heritage, DO

## 2022-12-05 LAB — CBC
Hematocrit: 36.7 % (ref 34.0–46.6)
Hemoglobin: 11.9 g/dL (ref 11.1–15.9)
MCH: 28.1 pg (ref 26.6–33.0)
MCHC: 32.4 g/dL (ref 31.5–35.7)
MCV: 87 fL (ref 79–97)
Platelets: 300 10*3/uL (ref 150–450)
RBC: 4.23 x10E6/uL (ref 3.77–5.28)
RDW: 13.7 % (ref 11.7–15.4)
WBC: 10.3 10*3/uL (ref 3.4–10.8)

## 2022-12-05 LAB — HIV ANTIBODY (ROUTINE TESTING W REFLEX): HIV Screen 4th Generation wRfx: NONREACTIVE

## 2022-12-05 LAB — GLUCOSE TOLERANCE, 2 HOURS W/ 1HR
Glucose, 1 hour: 122 mg/dL (ref 70–179)
Glucose, 2 hour: 114 mg/dL (ref 70–152)
Glucose, Fasting: 88 mg/dL (ref 70–91)

## 2022-12-05 LAB — RPR: RPR Ser Ql: NONREACTIVE

## 2022-12-18 ENCOUNTER — Ambulatory Visit (INDEPENDENT_AMBULATORY_CARE_PROVIDER_SITE_OTHER): Payer: Federal, State, Local not specified - PPO | Admitting: Family Medicine

## 2022-12-18 VITALS — BP 110/57 | HR 101 | Wt 239.0 lb

## 2022-12-18 DIAGNOSIS — O99810 Abnormal glucose complicating pregnancy: Secondary | ICD-10-CM

## 2022-12-18 DIAGNOSIS — O0990 Supervision of high risk pregnancy, unspecified, unspecified trimester: Secondary | ICD-10-CM

## 2022-12-18 DIAGNOSIS — E669 Obesity, unspecified: Secondary | ICD-10-CM

## 2022-12-18 DIAGNOSIS — O99212 Obesity complicating pregnancy, second trimester: Secondary | ICD-10-CM

## 2022-12-18 DIAGNOSIS — O99213 Obesity complicating pregnancy, third trimester: Secondary | ICD-10-CM

## 2022-12-18 DIAGNOSIS — R7303 Prediabetes: Secondary | ICD-10-CM

## 2022-12-18 DIAGNOSIS — Z3A30 30 weeks gestation of pregnancy: Secondary | ICD-10-CM

## 2022-12-18 NOTE — Progress Notes (Signed)
   PRENATAL VISIT NOTE  Subjective:  Crystal Bradford is a 32 y.o. G2P1001 at [redacted]w[redacted]d being seen today for ongoing prenatal care.  She is currently monitored for the following issues for this low-risk pregnancy and has Supervision of high risk pregnancy, antepartum; Prediabetes; and Obesity affecting pregnancy on their problem list.  Patient reports occasional contractions.  Contractions: Not present.  .  Movement: Present. Denies leaking of fluid.   The following portions of the patient's history were reviewed and updated as appropriate: allergies, current medications, past family history, past medical history, past social history, past surgical history and problem list.   Objective:   Vitals:   12/18/22 0915  BP: (!) 110/57  Pulse: (!) 101  Weight: 239 lb (108.4 kg)    Fetal Status: Fetal Heart Rate (bpm): 144 Fundal Height: 30 cm Movement: Present  Presentation: Vertex  General:  Alert, oriented and cooperative. Patient is in no acute distress.  Skin: Skin is warm and dry. No rash noted.   Cardiovascular: Normal heart rate noted  Respiratory: Normal respiratory effort, no problems with respiration noted  Abdomen: Soft, gravid, appropriate for gestational age.  Pain/Pressure: Present     Pelvic: Cervical exam deferred        Extremities: Normal range of motion.  Edema: Trace  Mental Status: Normal mood and affect. Normal behavior. Normal judgment and thought content.   Assessment and Plan:  Pregnancy: G2P1001 at [redacted]w[redacted]d 1. [redacted] weeks gestation of pregnancy  2. Supervision of high risk pregnancy, antepartum FHT normal  3. Prediabetes 2hr GTT normal  4. Obesity affecting pregnancy in second trimester, unspecified obesity type   Preterm labor symptoms and general obstetric precautions including but not limited to vaginal bleeding, contractions, leaking of fluid and fetal movement were reviewed in detail with the patient. Please refer to After Visit Summary for other counseling  recommendations.   No follow-ups on file.  Future Appointments  Date Time Provider Department Center  01/01/2023  9:35 AM Levie Heritage, DO [facility] None  01/03/2023  3:15 PM [facility] NURSE [facility]  01/03/2023  3:30 PM [facility] US1 [facility]  01/14/2023  9:15 AM Federico Flake, MD [facility] None  01/29/2023  9:15 AM Levie Heritage, DO [facility] None  02/12/2023  9:15 AM Levie Heritage, DO [facility] None    Levie Heritage, DO

## 2023-01-01 ENCOUNTER — Telehealth: Payer: Federal, State, Local not specified - PPO | Admitting: Family Medicine

## 2023-01-01 VITALS — BP 130/72

## 2023-01-01 NOTE — Progress Notes (Signed)
Patient states she feels she has some sort of virus. Patient c/o diarrhea x3 days and increased Braxton Hicks contractions.

## 2023-01-02 NOTE — Progress Notes (Signed)
Patient not seen by provider

## 2023-01-03 ENCOUNTER — Ambulatory Visit: Payer: Federal, State, Local not specified - PPO | Admitting: *Deleted

## 2023-01-03 ENCOUNTER — Ambulatory Visit: Payer: Federal, State, Local not specified - PPO | Attending: Maternal & Fetal Medicine

## 2023-01-03 VITALS — BP 126/57 | HR 111

## 2023-01-03 DIAGNOSIS — O99213 Obesity complicating pregnancy, third trimester: Secondary | ICD-10-CM | POA: Diagnosis not present

## 2023-01-03 DIAGNOSIS — E669 Obesity, unspecified: Secondary | ICD-10-CM

## 2023-01-03 DIAGNOSIS — O0990 Supervision of high risk pregnancy, unspecified, unspecified trimester: Secondary | ICD-10-CM

## 2023-01-03 DIAGNOSIS — O99210 Obesity complicating pregnancy, unspecified trimester: Secondary | ICD-10-CM | POA: Diagnosis not present

## 2023-01-03 DIAGNOSIS — O99810 Abnormal glucose complicating pregnancy: Secondary | ICD-10-CM

## 2023-01-03 DIAGNOSIS — O3663X0 Maternal care for excessive fetal growth, third trimester, not applicable or unspecified: Secondary | ICD-10-CM

## 2023-01-03 DIAGNOSIS — Z3A32 32 weeks gestation of pregnancy: Secondary | ICD-10-CM

## 2023-01-06 ENCOUNTER — Other Ambulatory Visit: Payer: Self-pay | Admitting: *Deleted

## 2023-01-06 DIAGNOSIS — O3663X0 Maternal care for excessive fetal growth, third trimester, not applicable or unspecified: Secondary | ICD-10-CM

## 2023-01-06 DIAGNOSIS — O99210 Obesity complicating pregnancy, unspecified trimester: Secondary | ICD-10-CM

## 2023-01-14 ENCOUNTER — Ambulatory Visit (INDEPENDENT_AMBULATORY_CARE_PROVIDER_SITE_OTHER): Payer: Federal, State, Local not specified - PPO | Admitting: Family Medicine

## 2023-01-14 VITALS — BP 126/63 | HR 103 | Wt 241.0 lb

## 2023-01-14 DIAGNOSIS — O99213 Obesity complicating pregnancy, third trimester: Secondary | ICD-10-CM

## 2023-01-14 DIAGNOSIS — R7303 Prediabetes: Secondary | ICD-10-CM

## 2023-01-14 DIAGNOSIS — O0990 Supervision of high risk pregnancy, unspecified, unspecified trimester: Secondary | ICD-10-CM

## 2023-01-14 DIAGNOSIS — Z23 Encounter for immunization: Secondary | ICD-10-CM

## 2023-01-14 DIAGNOSIS — Z3A33 33 weeks gestation of pregnancy: Secondary | ICD-10-CM

## 2023-01-14 DIAGNOSIS — O0993 Supervision of high risk pregnancy, unspecified, third trimester: Secondary | ICD-10-CM

## 2023-01-14 NOTE — Progress Notes (Signed)
   PRENATAL VISIT NOTE  Subjective:  Crystal Bradford is a 32 y.o. G2P1001 at [redacted]w[redacted]d being seen today for ongoing prenatal care.  She is currently monitored for the following issues for this low-risk pregnancy and has Supervision of high risk pregnancy, antepartum; Prediabetes; and Obesity affecting pregnancy on their problem list.  Patient reports no complaints.  Contractions: Irritability. Vag. Bleeding: None.  Movement: Present. Denies leaking of fluid.   The following portions of the patient's history were reviewed and updated as appropriate: allergies, current medications, past family history, past medical history, past social history, past surgical history and problem list.   Objective:   Vitals:   01/14/23 0923  BP: 126/63  Pulse: (!) 103  Weight: 241 lb (109.3 kg)    Fetal Status: Fetal Heart Rate (bpm): 150   Movement: Present     General:  Alert, oriented and cooperative. Patient is in no acute distress.  Skin: Skin is warm and dry. No rash noted.   Cardiovascular: Normal heart rate noted  Respiratory: Normal respiratory effort, no problems with respiration noted  Abdomen: Soft, gravid, appropriate for gestational age.  Pain/Pressure: Present     Pelvic: Cervical exam deferred        Extremities: Normal range of motion.  Edema: Trace  Mental Status: Normal mood and affect. Normal behavior. Normal judgment and thought content.   Assessment and Plan:  Pregnancy: G2P1001 at [redacted]w[redacted]d 1. Obesity affecting pregnancy in third trimester, unspecified obesity type (Primary) TWG= 4 lb (1.814 kg)  2. Prediabetes Passed GTT (88, 122, 114) Infant was 2709g >99th% at 32wk (AC >99th%, HC 98th% and BPD >99th%) Has gorwht US  in late Jan, possible LGA discussed and talked about possibility of earlier IOL but no definite at this point.   3. Supervision of high risk pregnancy, antepartum Doing well FH appropriate Vigorous movement Concerns/questions: sleeping, infant'a size on recent  US --discussed the EDD would not change and also had h/o  8# infant at 39wks Got Tdap and declined flu Discussed RSV vaccination briefly-- offer at next appt.   Preterm labor symptoms and general obstetric precautions including but not limited to vaginal bleeding, contractions, leaking of fluid and fetal movement were reviewed in detail with the patient. Please refer to After Visit Summary for other counseling recommendations.   Return in about 2 weeks (around 01/28/2023) for Routine prenatal care.  Future Appointments  Date Time Provider Department Center  01/29/2023  9:15 AM Stinson, Jacob J, DO [facility] None  02/07/2023  3:15 PM [facility] NURSE [facility]  02/07/2023  3:30 PM [facility] US2 [facility]  02/12/2023  9:15 AM Stinson, Jacob J, DO [facility] None  02/19/2023  9:15 AM Stinson, Jacob J, DO [facility] None  02/26/2023  9:15 AM Stinson, Jacob J, DO [facility] None    Suzen Maryan Masters, MD

## 2023-01-15 NOTE — L&D Delivery Note (Signed)
OB/GYN Faculty Practice Delivery Note  Crystal Bradford is a 33 y.o. G2P1001 s/p NSVD at [redacted]w[redacted]d. She was admitted for PROM 0430.   ROM: 18h 72m with clear fluid GBS Status: Negative/-- (01/15 0000) Maximum Maternal Temperature: Temp (24hrs), Avg:98.4 F (36.9 C), Min:98.1 F (36.7 C), Max:99.2 F (37.3 C)   Labor Progress: Initial SVE: 4/50/-3. Pitocin required. She then progressed to complete.   Delivery Date/Time: 02/17/23 2239 Delivery: Called to room and patient was feeling urge to push. Found to be complete and started pushing. Turtling noted. Two Rns at bedside. Head delivered OA to ROA over several contractions. No nuchal cord present. Shoulders did not deliver with gentle downward traction and maternal pushing. Shoulder dystocia called. Right shoulder was the anterior shoulder. RNs assisted placing mom into [place], and bed flattened. Was then able to rotate anterior and posterior shoulder slightly clockwise and delivered posterior arm (left). Babe then delivered after 60 seconds. Infant with spontaneous cry, placed on mother's abdomen, dried and stimulated. Cord clamped and cut by FOB after 1 minute delay. Babe with spontaneous movement of bilateral upper extremities. Cord gases not obtained. Cord blood drawn. Placenta delivered spontaneously with gentle cord traction. Fundus firm with massage and Pitocin. Slight trickle noted despite firm fundus; TXA given. Labia, perineum, and vagina inspected with no lacerations. Mom and baby to postpartum. Baby Weight: pending  Placenta: 3 vessel, intact. Sent to L&D Complications: Shoulder dystocia ~60 seconds Lacerations: None EBL: 202 mL Anesthesia: none  Infant: baby girl [name] APGAR (1 MIN):  7 APGAR (5 MINS):  9 APGAR (10 MINS):    Joanne Gavel, MD [facility] Family Medicine Fellow, [facility] for [facility], [facility] 02/17/2023, 10:55 PM

## 2023-01-20 ENCOUNTER — Encounter: Payer: Self-pay | Admitting: Family Medicine

## 2023-01-29 ENCOUNTER — Other Ambulatory Visit (HOSPITAL_BASED_OUTPATIENT_CLINIC_OR_DEPARTMENT_OTHER): Payer: Self-pay

## 2023-01-29 ENCOUNTER — Other Ambulatory Visit (HOSPITAL_COMMUNITY)
Admission: RE | Admit: 2023-01-29 | Discharge: 2023-01-29 | Disposition: A | Discharge status: Home / Self Care | Payer: Federal, State, Local not specified - PPO | Source: Ambulatory Visit | Attending: Family Medicine | Admitting: Family Medicine

## 2023-01-29 ENCOUNTER — Ambulatory Visit (INDEPENDENT_AMBULATORY_CARE_PROVIDER_SITE_OTHER): Payer: Federal, State, Local not specified - PPO | Admitting: Family Medicine

## 2023-01-29 VITALS — BP 131/67 | HR 92 | Wt 244.0 lb

## 2023-01-29 DIAGNOSIS — O0993 Supervision of high risk pregnancy, unspecified, third trimester: Secondary | ICD-10-CM

## 2023-01-29 DIAGNOSIS — O0990 Supervision of high risk pregnancy, unspecified, unspecified trimester: Secondary | ICD-10-CM | POA: Diagnosis not present

## 2023-01-29 DIAGNOSIS — O99213 Obesity complicating pregnancy, third trimester: Secondary | ICD-10-CM

## 2023-01-29 DIAGNOSIS — Z3A36 36 weeks gestation of pregnancy: Secondary | ICD-10-CM

## 2023-01-29 MED ORDER — ABRYSVO 120 MCG/0.5ML IM SOLR
INTRAMUSCULAR | 0 refills | Status: DC
  Filled 2023-01-29: qty 0.5, 1d supply, fill #0

## 2023-01-29 NOTE — Progress Notes (Signed)
   PRENATAL VISIT NOTE  Subjective:  Crystal Bradford is a 33 y.o. G2P1001 at [redacted]w[redacted]d being seen today for ongoing prenatal care.  She is currently monitored for the following issues for this low-risk pregnancy and has Supervision of high risk pregnancy, antepartum; Prediabetes; and Obesity affecting pregnancy on their problem list.  Patient reports occasional contractions.  Contractions: Not present. Vag. Bleeding: None.  Movement: Present. Denies leaking of fluid.   The following portions of the patient's history were reviewed and updated as appropriate: allergies, current medications, past family history, past medical history, past social history, past surgical history and problem list.   Objective:   Vitals:   01/29/23 0906  BP: 131/67  Pulse: 92  Weight: 244 lb (110.7 kg)    Fetal Status: Fetal Heart Rate (bpm): 154 Fundal Height: 36 cm Movement: Present  Presentation: Vertex  General:  Alert, oriented and cooperative. Patient is in no acute distress.  Skin: Skin is warm and dry. No rash noted.   Cardiovascular: Normal heart rate noted  Respiratory: Normal respiratory effort, no problems with respiration noted  Abdomen: Soft, gravid, appropriate for gestational age.  Pain/Pressure: Absent     Pelvic: Cervical exam performed in the presence of a chaperone Dilation: 1.5 Effacement (%): 50 Station: -2  Extremities: Normal range of motion.  Edema: Mild pitting, slight indentation  Mental Status: Normal mood and affect. Normal behavior. Normal judgment and thought content.   Assessment and Plan:  Pregnancy: G2P1001 at [redacted]w[redacted]d 1. Supervision of high risk pregnancy, antepartum (Primary) FHT normal  Term labor symptoms and general obstetric precautions including but not limited to vaginal bleeding, contractions, leaking of fluid and fetal movement were reviewed in detail with the patient. Please refer to After Visit Summary for other counseling recommendations.   No follow-ups on  file.  Future Appointments  Date Time Provider Department Center  02/07/2023  3:15 PM [facility] NURSE [facility]  02/07/2023  3:30 PM [facility] US2 [facility]  02/12/2023  9:15 AM Lanier Felty J, DO [facility] None  02/19/2023  9:15 AM Mariaeduarda Defranco J, DO [facility] None  02/26/2023  9:15 AM Thayden Lemire J, DO [facility] None    Raye Slyter J Athalee Esterline, DO

## 2023-01-30 LAB — CERVICOVAGINAL ANCILLARY ONLY
Chlamydia: NEGATIVE
Comment: NEGATIVE
Comment: NORMAL
Neisseria Gonorrhea: NEGATIVE

## 2023-01-31 ENCOUNTER — Encounter: Payer: Self-pay | Admitting: Family Medicine

## 2023-01-31 LAB — STREP GP B NAA: Strep Gp B NAA: NEGATIVE

## 2023-01-31 LAB — SPECIMEN STATUS REPORT

## 2023-02-07 ENCOUNTER — Ambulatory Visit: Payer: Federal, State, Local not specified - PPO | Admitting: *Deleted

## 2023-02-07 ENCOUNTER — Other Ambulatory Visit: Payer: Self-pay

## 2023-02-07 ENCOUNTER — Ambulatory Visit: Payer: Federal, State, Local not specified - PPO | Attending: Obstetrics and Gynecology

## 2023-02-07 VITALS — BP 130/53 | HR 99

## 2023-02-07 DIAGNOSIS — O99213 Obesity complicating pregnancy, third trimester: Secondary | ICD-10-CM

## 2023-02-07 DIAGNOSIS — O99810 Abnormal glucose complicating pregnancy: Secondary | ICD-10-CM

## 2023-02-07 DIAGNOSIS — O99210 Obesity complicating pregnancy, unspecified trimester: Secondary | ICD-10-CM | POA: Diagnosis not present

## 2023-02-07 DIAGNOSIS — O0990 Supervision of high risk pregnancy, unspecified, unspecified trimester: Secondary | ICD-10-CM | POA: Diagnosis not present

## 2023-02-07 DIAGNOSIS — O3663X0 Maternal care for excessive fetal growth, third trimester, not applicable or unspecified: Secondary | ICD-10-CM | POA: Diagnosis not present

## 2023-02-07 DIAGNOSIS — E669 Obesity, unspecified: Secondary | ICD-10-CM

## 2023-02-07 DIAGNOSIS — Z3A37 37 weeks gestation of pregnancy: Secondary | ICD-10-CM

## 2023-02-12 ENCOUNTER — Other Ambulatory Visit: Payer: Self-pay | Admitting: Advanced Practice Midwife

## 2023-02-12 ENCOUNTER — Ambulatory Visit (INDEPENDENT_AMBULATORY_CARE_PROVIDER_SITE_OTHER): Payer: Federal, State, Local not specified - PPO | Admitting: Family Medicine

## 2023-02-12 VITALS — BP 132/73 | HR 100 | Wt 249.0 lb

## 2023-02-12 DIAGNOSIS — O0990 Supervision of high risk pregnancy, unspecified, unspecified trimester: Secondary | ICD-10-CM

## 2023-02-12 DIAGNOSIS — O99213 Obesity complicating pregnancy, third trimester: Secondary | ICD-10-CM

## 2023-02-12 DIAGNOSIS — R7303 Prediabetes: Secondary | ICD-10-CM

## 2023-02-12 DIAGNOSIS — Z3A38 38 weeks gestation of pregnancy: Secondary | ICD-10-CM

## 2023-02-12 DIAGNOSIS — O3663X0 Maternal care for excessive fetal growth, third trimester, not applicable or unspecified: Secondary | ICD-10-CM

## 2023-02-12 NOTE — Progress Notes (Signed)
   PRENATAL VISIT NOTE  Subjective:  Crystal Bradford is a 33 y.o. G2P1001 at [redacted]w[redacted]d being seen today for ongoing prenatal care.  She is currently monitored for the following issues for this high-risk pregnancy and has Supervision of high risk pregnancy, antepartum; Prediabetes; and Obesity affecting pregnancy on their problem list.  Patient reports no complaints.  Contractions: Irritability. Vag. Bleeding: Scant.  Movement: (!) Decreased. Denies leaking of fluid.   The following portions of the patient's history were reviewed and updated as appropriate: allergies, current medications, past family history, past medical history, past social history, past surgical history and problem list.   Objective:   Vitals:   02/12/23 0915  BP: 132/73  Pulse: 100  Weight: 249 lb (112.9 kg)    Fetal Status:     Movement: (!) Decreased     General:  Alert, oriented and cooperative. Patient is in no acute distress.  Skin: Skin is warm and dry. No rash noted.   Cardiovascular: Normal heart rate noted  Respiratory: Normal respiratory effort, no problems with respiration noted  Abdomen: Soft, gravid, appropriate for gestational age.  Pain/Pressure: Present     Pelvic: Cervical exam deferred        Extremities: Normal range of motion.  Edema: Trace  Mental Status: Normal mood and affect. Normal behavior. Normal judgment and thought content.   Assessment and Plan:  Pregnancy: G2P1001 at [redacted]w[redacted]d 1. [redacted] weeks gestation of pregnancy (Primary)  2. Supervision of high risk pregnancy, antepartum FHT normal - NST reactive  3. Prediabetes  4. Obesity affecting pregnancy in third trimester, unspecified obesity type  5. Excessive fetal growth affecting management of pregnancy in third trimester, single or unspecified fetus Induction recommended MFM at 28  Term labor symptoms and general obstetric precautions including but not limited to vaginal bleeding, contractions, leaking of fluid and fetal movement were  reviewed in detail with the patient. Please refer to After Visit Summary for other counseling recommendations.   No follow-ups on file.  Future Appointments  Date Time Provider Department Center  02/19/2023 12:00 AM [facility] SCHED ROOM [facility] None  02/19/2023  9:15 AM Levie Heritage, DO [facility] None  02/26/2023  9:15 AM Levie Heritage, DO [facility] None    Levie Heritage, DO

## 2023-02-17 ENCOUNTER — Encounter (HOSPITAL_COMMUNITY): Payer: Self-pay | Admitting: Obstetrics and Gynecology

## 2023-02-17 ENCOUNTER — Inpatient Hospital Stay (HOSPITAL_COMMUNITY): Payer: Federal, State, Local not specified - PPO | Admitting: Anesthesiology

## 2023-02-17 ENCOUNTER — Other Ambulatory Visit: Payer: Self-pay

## 2023-02-17 ENCOUNTER — Inpatient Hospital Stay (HOSPITAL_COMMUNITY)
Admission: AD | Admit: 2023-02-17 | Discharge: 2023-02-19 | DRG: 807 | Disposition: A | Discharge status: Home / Self Care | Payer: Federal, State, Local not specified - PPO | Attending: Family Medicine | Admitting: Family Medicine

## 2023-02-17 DIAGNOSIS — O3660X0 Maternal care for excessive fetal growth, unspecified trimester, not applicable or unspecified: Secondary | ICD-10-CM | POA: Insufficient documentation

## 2023-02-17 DIAGNOSIS — O4202 Full-term premature rupture of membranes, onset of labor within 24 hours of rupture: Secondary | ICD-10-CM | POA: Diagnosis not present

## 2023-02-17 DIAGNOSIS — Z3A38 38 weeks gestation of pregnancy: Secondary | ICD-10-CM | POA: Diagnosis not present

## 2023-02-17 DIAGNOSIS — Z8249 Family history of ischemic heart disease and other diseases of the circulatory system: Secondary | ICD-10-CM

## 2023-02-17 DIAGNOSIS — O26893 Other specified pregnancy related conditions, third trimester: Secondary | ICD-10-CM | POA: Diagnosis not present

## 2023-02-17 DIAGNOSIS — O3663X0 Maternal care for excessive fetal growth, third trimester, not applicable or unspecified: Secondary | ICD-10-CM | POA: Diagnosis not present

## 2023-02-17 DIAGNOSIS — O4292 Full-term premature rupture of membranes, unspecified as to length of time between rupture and onset of labor: Secondary | ICD-10-CM | POA: Diagnosis not present

## 2023-02-17 DIAGNOSIS — E66813 Obesity, class 3: Secondary | ICD-10-CM | POA: Diagnosis not present

## 2023-02-17 DIAGNOSIS — O4290 Premature rupture of membranes, unspecified as to length of time between rupture and onset of labor, unspecified weeks of gestation: Secondary | ICD-10-CM | POA: Diagnosis present

## 2023-02-17 DIAGNOSIS — O99210 Obesity complicating pregnancy, unspecified trimester: Secondary | ICD-10-CM | POA: Diagnosis present

## 2023-02-17 DIAGNOSIS — Z833 Family history of diabetes mellitus: Secondary | ICD-10-CM

## 2023-02-17 DIAGNOSIS — O99214 Obesity complicating childbirth: Secondary | ICD-10-CM | POA: Diagnosis not present

## 2023-02-17 DIAGNOSIS — O0990 Supervision of high risk pregnancy, unspecified, unspecified trimester: Secondary | ICD-10-CM

## 2023-02-17 DIAGNOSIS — R7303 Prediabetes: Secondary | ICD-10-CM | POA: Diagnosis not present

## 2023-02-17 LAB — TYPE AND SCREEN
ABO/RH(D): A POS
Antibody Screen: NEGATIVE

## 2023-02-17 LAB — CBC
HCT: 39.7 % (ref 36.0–46.0)
Hemoglobin: 12.9 g/dL (ref 12.0–15.0)
MCH: 28 pg (ref 26.0–34.0)
MCHC: 32.5 g/dL (ref 30.0–36.0)
MCV: 86.3 fL (ref 80.0–100.0)
Platelets: 245 10*3/uL (ref 150–400)
RBC: 4.6 MIL/uL (ref 3.87–5.11)
RDW: 15 % (ref 11.5–15.5)
WBC: 10.5 10*3/uL (ref 4.0–10.5)
nRBC: 0 % (ref 0.0–0.2)

## 2023-02-17 LAB — RPR: RPR Ser Ql: NONREACTIVE

## 2023-02-17 LAB — COMPREHENSIVE METABOLIC PANEL
ALT: 14 U/L (ref 0–44)
AST: 16 U/L (ref 15–41)
Albumin: 2.7 g/dL — ABNORMAL LOW (ref 3.5–5.0)
Alkaline Phosphatase: 150 U/L — ABNORMAL HIGH (ref 38–126)
Anion gap: 11 (ref 5–15)
BUN: 9 mg/dL (ref 6–20)
CO2: 18 mmol/L — ABNORMAL LOW (ref 22–32)
Calcium: 8.9 mg/dL (ref 8.9–10.3)
Chloride: 105 mmol/L (ref 98–111)
Creatinine, Ser: 0.61 mg/dL (ref 0.44–1.00)
GFR, Estimated: >60 mL/min (ref >60)
Glucose, Bld: 85 mg/dL (ref 70–99)
Potassium: 4 mmol/L (ref 3.5–5.1)
Sodium: 134 mmol/L — ABNORMAL LOW (ref 135–145)
Total Bilirubin: 0.5 mg/dL (ref 0.0–1.2)
Total Protein: 6.5 g/dL (ref 6.5–8.1)

## 2023-02-17 LAB — POCT FERN TEST: POCT Fern Test: POSITIVE

## 2023-02-17 MED ORDER — LACTATED RINGERS IV SOLN
500.0000 mL | Freq: Once | INTRAVENOUS | Status: AC
  Administered 2023-02-17: 500 mL via INTRAVENOUS

## 2023-02-17 MED ORDER — PHENYLEPHRINE 80 MCG/ML (10ML) SYRINGE FOR IV PUSH (FOR BLOOD PRESSURE SUPPORT): 80.0000 ug | PREFILLED_SYRINGE | INTRAVENOUS | Status: DC | PRN

## 2023-02-17 MED ORDER — LIDOCAINE HCL (PF) 1 % IJ SOLN
30.0000 mL | INTRAMUSCULAR | Status: DC | PRN
Start: 2023-02-17 — End: 2023-02-18

## 2023-02-17 MED ORDER — OXYTOCIN BOLUS FROM INFUSION: 333.0000 mL | Freq: Once | INTRAVENOUS | Status: DC

## 2023-02-17 MED ORDER — OXYTOCIN-SODIUM CHLORIDE 30-0.9 UT/500ML-% IV SOLN
1.0000 m[IU]/min | INTRAVENOUS | Status: DC
  Administered 2023-02-17: 2 m[IU]/min via INTRAVENOUS
  Filled 2023-02-17: qty 500

## 2023-02-17 MED ORDER — ONDANSETRON HCL 4 MG/2ML IJ SOLN: 4.0000 mg | Freq: Four times a day (QID) | INTRAMUSCULAR | Status: DC | PRN

## 2023-02-17 MED ORDER — ACETAMINOPHEN 325 MG PO TABS: 650.0000 mg | ORAL_TABLET | ORAL | Status: DC | PRN

## 2023-02-17 MED ORDER — OXYCODONE-ACETAMINOPHEN 5-325 MG PO TABS: 1.0000 | ORAL_TABLET | ORAL | Status: DC | PRN

## 2023-02-17 MED ORDER — PHENYLEPHRINE 80 MCG/ML (10ML) SYRINGE FOR IV PUSH (FOR BLOOD PRESSURE SUPPORT)
80.0000 ug | PREFILLED_SYRINGE | INTRAVENOUS | Status: DC | PRN
Start: 2023-02-17 — End: 2023-02-18

## 2023-02-17 MED ORDER — TRANEXAMIC ACID-NACL 1000-0.7 MG/100ML-% IV SOLN
INTRAVENOUS | Status: AC
  Administered 2023-02-17: 1000 mg
  Filled 2023-02-17: qty 100

## 2023-02-17 MED ORDER — SOD CITRATE-CITRIC ACID 500-334 MG/5ML PO SOLN: 30.0000 mL | ORAL | Status: DC | PRN

## 2023-02-17 MED ORDER — DIPHENHYDRAMINE HCL 50 MG/ML IJ SOLN: 12.5000 mg | INTRAMUSCULAR | Status: DC | PRN

## 2023-02-17 MED ORDER — LIDOCAINE HCL (PF) 1 % IJ SOLN: 30.0000 mL | INTRAMUSCULAR | Status: DC | PRN

## 2023-02-17 MED ORDER — FENTANYL CITRATE (PF) 100 MCG/2ML IJ SOLN
100.0000 ug | INTRAMUSCULAR | Status: DC | PRN
  Administered 2023-02-17: 100 ug via INTRAVENOUS
  Filled 2023-02-17 (×2): qty 2

## 2023-02-17 MED ORDER — LACTATED RINGERS IV SOLN: INTRAVENOUS | Status: DC

## 2023-02-17 MED ORDER — FENTANYL-BUPIVACAINE-NACL 0.5-0.125-0.9 MG/250ML-% EP SOLN
12.0000 mL/h | EPIDURAL | Status: DC | PRN
  Administered 2023-02-17: 12 mL/h via EPIDURAL
  Filled 2023-02-17: qty 250

## 2023-02-17 MED ORDER — OXYTOCIN BOLUS FROM INFUSION
333.0000 mL | Freq: Once | INTRAVENOUS | Status: AC
  Administered 2023-02-17: 333 mL via INTRAVENOUS

## 2023-02-17 MED ORDER — LACTATED RINGERS IV SOLN: 500.0000 mL | INTRAVENOUS | Status: DC | PRN

## 2023-02-17 MED ORDER — TERBUTALINE SULFATE 1 MG/ML IJ SOLN: 0.2500 mg | Freq: Once | INTRAMUSCULAR | Status: DC | PRN

## 2023-02-17 MED ORDER — EPHEDRINE 5 MG/ML INJ: 10.0000 mg | INTRAVENOUS | Status: DC | PRN

## 2023-02-17 MED ORDER — OXYTOCIN-SODIUM CHLORIDE 30-0.9 UT/500ML-% IV SOLN: 2.5000 [IU]/h | INTRAVENOUS | Status: DC

## 2023-02-17 MED ORDER — FLEET ENEMA RE ENEM: 1.0000 | ENEMA | RECTAL | Status: DC | PRN

## 2023-02-17 MED ORDER — OXYTOCIN-SODIUM CHLORIDE 30-0.9 UT/500ML-% IV SOLN: 1.0000 m[IU]/min | INTRAVENOUS | Status: DC

## 2023-02-17 MED ORDER — OXYCODONE-ACETAMINOPHEN 5-325 MG PO TABS: 2.0000 | ORAL_TABLET | ORAL | Status: DC | PRN

## 2023-02-17 MED ORDER — LIDOCAINE HCL (PF) 1 % IJ SOLN
INTRAMUSCULAR | Status: DC | PRN
  Administered 2023-02-17 (×2): 4 mL via EPIDURAL

## 2023-02-17 MED ORDER — OXYTOCIN-SODIUM CHLORIDE 30-0.9 UT/500ML-% IV SOLN
2.5000 [IU]/h | INTRAVENOUS | Status: DC
  Administered 2023-02-17: 2.5 [IU]/h via INTRAVENOUS

## 2023-02-17 NOTE — MAU Note (Signed)

## 2023-02-17 NOTE — H&P (Signed)
Crystal Bradford is a 33 y.o. female G2P1001 at [redacted]w[redacted]d presenting for SROM. Pregnancy complicated by LGA. Started having constant leaking of fluid this morning around 4am.  OB History     Gravida  2   Para  1   Term  1   Preterm      AB      Living  1      SAB      IAB      Ectopic      Multiple  0   Live Births  1          Past Medical History:  Diagnosis Date   Anemia    Medical history non-contributory    Past Surgical History:  Procedure Laterality Date   NO PAST SURGERIES     Family History: family history includes Arthritis in her mother; Cancer in her maternal grandfather; Diabetes in her maternal grandfather, maternal grandmother, mother, paternal grandfather, and paternal grandmother; Heart disease in her maternal grandfather; Hypertension in her maternal grandfather, maternal grandmother, mother, paternal grandfather, and paternal grandmother. Social History:  reports that she has never smoked. She has never used smokeless tobacco. She reports that she does not drink alcohol and does not use drugs.     Maternal Diabetes: No Genetic Screening: Normal Maternal Ultrasounds/Referrals: Normal Fetal Ultrasounds or other Referrals:  None Maternal Substance Abuse:  No Significant Maternal Medications:  None Significant Maternal Lab Results:  None Number of Prenatal Visits:greater than 3 verified prenatal visits Maternal Vaccinations:RSV: Given during pregnancy >/=14 days ago, TDap, and Flu Other Comments:  None  Review of Systems History   Blood pressure 136/66, pulse (!) 113, temperature 99.2 F (37.3 C), temperature source Oral, resp. rate 14, last menstrual period 05/06/2022, SpO2 97%, currently breastfeeding. Maternal Exam:  Uterine Assessment: Contraction strength is moderate.  Contraction frequency is regular.  Abdomen: Fundal height is term.   Estimated fetal weight is 8,5.   Fetal presentation: vertex Pelvis: adequate for delivery.      Physical Exam Vitals reviewed.  Constitutional:      Appearance: Normal appearance.  Pulmonary:     Effort: Pulmonary effort is normal.     Breath sounds: Normal breath sounds.  Abdominal:     General: Abdomen is flat.     Palpations: Abdomen is soft.  Skin:    Capillary Refill: Capillary refill takes less than 2 seconds.  Neurological:     General: No focal deficit present.     Mental Status: She is alert.  Psychiatric:        Mood and Affect: Mood normal.        Behavior: Behavior normal.        Thought Content: Thought content normal.        Judgment: Judgment normal.     Prenatal labs: ABO, Rh: --/--/PENDING (02/03 1246) Antibody: PENDING (02/03 1246) Rubella: 3.42 (07/17 1541) RPR: Non Reactive (11/20 0818)  HBsAg: Negative (07/17 1541)  HIV: Non Reactive (11/20 0818)  GBS: Negative/-- (01/15 0000)   Assessment/Plan: 32yo G2P1001 38 weeks 5 days IUP LGA SROM GBS negative  Plans on not getting an epidural GBS neg Female Plans on breast feeding Category 1 tracing Expectant management   Levie Heritage 02/17/2023, 1:03 PM

## 2023-02-17 NOTE — Discharge Summary (Shared)
Postpartum Discharge Summary  Date of Service updated***     Patient Name: Crystal Bradford DOB: 09/27/1990 MRN: [id number]  Date of admission: 02/17/2023 Delivery date:02/17/2023 Delivering provider: Joanne Gavel Date of discharge: 02/17/2023  Admitting diagnosis: Normal labor [O80, Z37.9] Encounter for induction of labor [Z34.90] Intrauterine pregnancy: [redacted]w[redacted]d     Secondary diagnosis:  Principal Problem:   NSVD (normal spontaneous vaginal delivery) Active Problems:   Supervision of high risk pregnancy, antepartum   Prediabetes   Obesity affecting pregnancy   PROM (premature rupture of membranes)   Shoulder dystocia during labor and delivery, delivered  Additional problems: ***    Discharge diagnosis: Term Pregnancy Delivered                                              Post partum procedures:*** Augmentation: Pitocin Complications: None  Hospital course: Onset of Labor With Vaginal Delivery      32 y.o. yo G2P1001 at [redacted]w[redacted]d was admitted in Latent Labor for PROM on 02/17/2023. Started on pitocin and made quick progress to complete. 60 second shoulder dystocia complicating delivery. Membrane Rupture Time/Date: 4:30 AM,02/17/2023  Delivery Method:Vaginal, Spontaneous Operative Delivery:N/A Episiotomy: None Lacerations:  None Patient had a postpartum course complicated by ***.  She is ambulating, tolerating a regular diet, passing flatus, and urinating well. Patient is discharged home in stable condition on 02/17/23.  Newborn Data: Birth date:02/17/2023 Birth time:10:39 PM Gender:Female Living status:Living Apgars:7 ,9  Weight:   Magnesium Sulfate received: No BMZ received: No Rhophylac:No MMR:No T-DaP:Given prenatally Flu: No RSV Vaccine received: Yes Transfusion:{Transfusion received:30440034}  Immunizations received: Immunization History  Administered Date(s) Administered   Influenza,inj,Quad PF,6+ Mos 11/26/2018   PFIZER(Purple Top)SARS-COV-2 Vaccination  07/23/2019, 08/13/2019   Rsv, Bivalent, Protein Subunit Rsvpref,pf Verdis Frederickson) 01/29/2023   Tdap 03/18/2019, 01/14/2023    Physical exam  Vitals:   02/17/23 2101 02/17/23 2130 02/17/23 2201 02/17/23 2246  BP: (!) 136/57 (!) 136/57 (!) 130/59 123/74  Pulse: (!) 101 98 94 (!) 166  Resp:      Temp:      TempSrc:      SpO2:  99%    Weight:      Height:       General: {Exam; general:21111117} Lochia: {Desc; appropriate/inappropriate:30686::"appropriate"} Uterine Fundus: {Desc; firm/soft:30687} Incision: {Exam; incision:21111123} DVT Evaluation: {Exam; dvt:2111122} Labs: Lab Results  Component Value Date   WBC 10.5 02/17/2023   HGB 12.9 02/17/2023   HCT 39.7 02/17/2023   MCV 86.3 02/17/2023   PLT 245 02/17/2023      Latest Ref Rng & Units 02/17/2023   12:47 PM  CMP  Glucose 70 - 99 mg/dL 85   BUN 6 - 20 mg/dL 9   Creatinine 6.96 - 2.95 mg/dL 2.84   Sodium 132 - 440 mmol/L 134   Potassium 3.5 - 5.1 mmol/L 4.0   Chloride 98 - 111 mmol/L 105   CO2 22 - 32 mmol/L 18   Calcium 8.9 - 10.3 mg/dL 8.9   Total Protein 6.5 - 8.1 g/dL 6.5   Total Bilirubin 0.0 - 1.2 mg/dL 0.5   Alkaline Phos 38 - 126 U/L 150   AST 15 - 41 U/L 16   ALT 0 - 44 U/L 14    Edinburgh Score:    07/22/2019    1:07 PM  Edinburgh Postnatal Depression Scale Screening Tool  I  have been able to laugh and see the funny side of things. 0  I have looked forward with enjoyment to things. 0  I have blamed myself unnecessarily when things went wrong. 0  I have been anxious or worried for no good reason. 0  I have felt scared or panicky for no good reason. 0  Things have been getting on top of me. 0  I have been so unhappy that I have had difficulty sleeping. 0  I have felt sad or miserable. 0  I have been so unhappy that I have been crying. 0  The thought of harming myself has occurred to me. 0  Edinburgh Postnatal Depression Scale Total 0   No data recorded  After visit meds:  Allergies as of 02/17/2023        Reactions   Penicillin G Rash   Amoxicillin Hives, Swelling     Med Rec must be completed prior to using this [facility]***        Discharge home in stable condition Infant Feeding: {Baby feeding:23562} Infant Disposition:{CHL IP OB HOME WITH JXBJYN:82956} Discharge instruction: per After Visit Summary and Postpartum booklet. Activity: Advance as tolerated. Pelvic rest for 6 weeks.  Diet: {OB OZHY:86578469} Future Appointments: Future Appointments  Date Time Provider Department Center  02/19/2023  9:15 AM Levie Heritage, DO [facility] None  02/26/2023  9:15 AM Levie Heritage, DO [facility] None   Follow up Visit:  Message sent to [facility] 02/17/23  Please schedule this patient for a In person postpartum visit in 6 weeks with the following provider: Any provider. Additional Postpartum F/U: none   High risk pregnancy complicated by:  LGA, SD Delivery mode:  Vaginal, Spontaneous Anticipated Birth Control:  Unsure   02/17/2023 Joanne Gavel, MD

## 2023-02-17 NOTE — MAU Note (Signed)
.  Crystal Bradford is a 33 y.o. at [redacted]w[redacted]d here in MAU reporting: SROM at 0430 this morning, states she is now contracting every 7 minutes. Denies VB. +FM.   Onset of complaint: 0430 Pain score: 5 Vitals:   02/17/23 1220  BP: 136/66  Pulse: (!) 113  Resp: 14  Temp: 99.2 F (37.3 C)  SpO2: 97%      Lab orders placed from triage:   fern

## 2023-02-17 NOTE — Anesthesia Preprocedure Evaluation (Signed)
 Anesthesia Evaluation  Patient identified by MRN, date of birth, ID band Patient awake    Reviewed: Allergy & Precautions, Patient's Chart, lab work & pertinent test results  History of Anesthesia Complications Negative for: history of anesthetic complications  Airway Mallampati: II  TM Distance: >3 FB Neck ROM: Full    Dental no notable dental hx.    Pulmonary neg pulmonary ROS   Pulmonary exam normal        Cardiovascular negative cardio ROS Normal cardiovascular exam     Neuro/Psych negative neurological ROS  negative psych ROS   GI/Hepatic negative GI ROS, Neg liver ROS,,,  Endo/Other    Class 3 obesity  Renal/GU negative Renal ROS  negative genitourinary   Musculoskeletal negative musculoskeletal ROS (+)    Abdominal   Peds  Hematology negative hematology ROS (+)   Anesthesia Other Findings Day of surgery medications reviewed with patient.  Reproductive/Obstetrics (+) Pregnancy                              Anesthesia Physical Anesthesia Plan  ASA: 3  Anesthesia Plan: Epidural   Post-op Pain Management:    Induction:   PONV Risk Score and Plan: Treatment may vary due to age or medical condition  Airway Management Planned: Natural Airway  Additional Equipment: Fetal Monitoring  Intra-op Plan:   Post-operative Plan:   Informed Consent: I have reviewed the patients History and Physical, chart, labs and discussed the procedure including the risks, benefits and alternatives for the proposed anesthesia with the patient or authorized representative who has indicated his/her understanding and acceptance.       Plan Discussed with:   Anesthesia Plan Comments:          Anesthesia Quick Evaluation

## 2023-02-17 NOTE — Anesthesia Procedure Notes (Signed)
Epidural Patient location during procedure: OB Start time: 02/17/2023 8:26 PM End time: 02/17/2023 8:29 PM  Staffing Anesthesiologist: Kaylyn Layer, MD Performed: anesthesiologist   Preanesthetic Checklist Completed: patient identified, IV checked, risks and benefits discussed, monitors and equipment checked, pre-op evaluation and timeout performed  Epidural Patient position: sitting Prep: DuraPrep and site prepped and draped Patient monitoring: continuous pulse ox, blood pressure and heart rate Approach: midline Location: L3-L4 Injection technique: LOR air  Needle:  Needle type: Tuohy  Needle gauge: 17 G Needle length: 9 cm Needle insertion depth: 7 cm Catheter type: closed end flexible Catheter size: 19 Gauge Catheter at skin depth: 12 cm Test dose: negative and Other (1% lidocaine)  Assessment Events: blood not aspirated, no cerebrospinal fluid, injection not painful, no injection resistance, no paresthesia and negative IV test  Additional Notes Patient identified. Risks, benefits, and alternatives discussed with patient including but not limited to bleeding, infection, nerve damage, paralysis, failed block, incomplete pain control, headache, blood pressure changes, nausea, vomiting, reactions to medication, itching, and postpartum back pain. Confirmed with bedside nurse the patient's most recent platelet count. Confirmed with patient that they are not currently taking any anticoagulation, have any bleeding history, or any family history of bleeding disorders. Patient expressed understanding and wished to proceed. All questions were answered. Sterile technique was used throughout the entire procedure. Please see nursing notes for vital signs.   Crisp LOR on first pass. Test dose was given through epidural catheter and negative prior to continuing to dose epidural or start infusion. Warning signs of high block given to the patient including shortness of breath, tingling/numbness  in hands, complete motor block, or any concerning symptoms with instructions to call for help. Patient was given instructions on fall risk and not to get out of bed. All questions and concerns addressed with instructions to call with any issues or inadequate analgesia.  Reason for block:procedure for pain

## 2023-02-18 MED ORDER — DIPHENHYDRAMINE HCL 25 MG PO CAPS: 25.0000 mg | ORAL_CAPSULE | Freq: Four times a day (QID) | ORAL | Status: DC | PRN

## 2023-02-18 MED ORDER — OXYCODONE HCL 5 MG PO TABS
5.0000 mg | ORAL_TABLET | Freq: Four times a day (QID) | ORAL | Status: DC | PRN
Start: 2023-02-18 — End: 2023-02-19

## 2023-02-18 MED ORDER — COCONUT OIL OIL: 1.0000 | TOPICAL_OIL | Status: DC | PRN

## 2023-02-18 MED ORDER — ACETAMINOPHEN 500 MG PO TABS
1000.0000 mg | ORAL_TABLET | Freq: Three times a day (TID) | ORAL | Status: DC
  Administered 2023-02-18 – 2023-02-19 (×5): 1000 mg via ORAL
  Filled 2023-02-18 (×5): qty 2

## 2023-02-18 MED ORDER — SENNOSIDES-DOCUSATE SODIUM 8.6-50 MG PO TABS
2.0000 | ORAL_TABLET | Freq: Every day | ORAL | Status: DC
  Administered 2023-02-18 – 2023-02-19 (×2): 2 via ORAL
  Filled 2023-02-18 (×2): qty 2

## 2023-02-18 MED ORDER — IBUPROFEN 800 MG PO TABS
800.0000 mg | ORAL_TABLET | Freq: Three times a day (TID) | ORAL | Status: DC
  Administered 2023-02-18 – 2023-02-19 (×4): 800 mg via ORAL
  Filled 2023-02-18 (×4): qty 1

## 2023-02-18 MED ORDER — ONDANSETRON HCL 4 MG PO TABS
4.0000 mg | ORAL_TABLET | ORAL | Status: DC | PRN
Start: 2023-02-18 — End: 2023-02-19

## 2023-02-18 MED ORDER — DIBUCAINE (PERIANAL) 1 % EX OINT: 1.0000 | TOPICAL_OINTMENT | CUTANEOUS | Status: DC | PRN

## 2023-02-18 MED ORDER — ONDANSETRON HCL 4 MG/2ML IJ SOLN: 4.0000 mg | INTRAMUSCULAR | Status: DC | PRN

## 2023-02-18 MED ORDER — LORATADINE 10 MG PO TABS
10.0000 mg | ORAL_TABLET | Freq: Every day | ORAL | Status: DC
  Administered 2023-02-18 – 2023-02-19 (×2): 10 mg via ORAL
  Filled 2023-02-18 (×2): qty 1

## 2023-02-18 MED ORDER — MEDROXYPROGESTERONE ACETATE 150 MG/ML IM SUSP
150.0000 mg | INTRAMUSCULAR | Status: DC | PRN
Start: 2023-02-18 — End: 2023-02-19

## 2023-02-18 MED ORDER — PRENATAL MULTIVITAMIN CH
1.0000 | ORAL_TABLET | Freq: Every day | ORAL | Status: DC
  Administered 2023-02-18: 1 via ORAL
  Filled 2023-02-18: qty 1

## 2023-02-18 MED ORDER — BENZOCAINE-MENTHOL 20-0.5 % EX AERO: 1.0000 | INHALATION_SPRAY | CUTANEOUS | Status: DC | PRN

## 2023-02-18 MED ORDER — SIMETHICONE 80 MG PO CHEW: 80.0000 mg | CHEWABLE_TABLET | ORAL | Status: DC | PRN

## 2023-02-18 MED ORDER — ZOLPIDEM TARTRATE 5 MG PO TABS: 5.0000 mg | ORAL_TABLET | Freq: Every evening | ORAL | Status: DC | PRN

## 2023-02-18 MED ORDER — OXYCODONE HCL 5 MG PO TABS: 10.0000 mg | ORAL_TABLET | Freq: Four times a day (QID) | ORAL | Status: DC | PRN

## 2023-02-18 MED ORDER — WITCH HAZEL-GLYCERIN EX PADS: 1.0000 | MEDICATED_PAD | CUTANEOUS | Status: DC | PRN

## 2023-02-18 NOTE — Lactation Note (Signed)
 This note was copied from a baby's chart. Lactation Consultation Note  Patient Name: Crystal Bradford Date: 02/18/2023 Age:32 hours Reason for consult: Initial assessment;Early term 37-38.6wks  P2 mom of 11 hour old infant consulted for initial assessment. Mom actively nursing baby [name] on the breast in cross cradle hold. Mom reports breastfeeding has been going well since birth. Endorses minor discomfort with the latch. LC discussed deep latch and what to look for with successful latching. Reports having a possible oversupply with first child. LC was able to observe breastfeeding session, infant content post latch. Reviewed breastfeeding education. Mom has a personal pump for home. Discussed option for donor breast milk should mom need to supplement baby while in-house. Mom will let lactation/RN know if she desires to provide donor human milk. A manual pump was given at request.   Maternal Data Has patient been taught Hand Expression?: Yes Does the patient have breastfeeding experience prior to this delivery?: Yes How long did the patient breastfeed?: 8 months (now 2.32 yo)  Feeding Mother's Current Feeding Choice: Breast Milk  LATCH Score Latch: Grasps breast easily, tongue down, lips flanged, rhythmical sucking.  Audible Swallowing: Spontaneous and intermittent  Type of Nipple: Everted at rest and after stimulation  Comfort (Breast/Nipple): Filling, red/small blisters or bruises, mild/mod discomfort  Hold (Positioning): No assistance needed to correctly position infant at breast. (mild discomfort)  LATCH Score: 9   Lactation Tools Discussed/Used Tools: Pump;Flanges Flange Size: 18;21 Breast pump type: Manual Reason for Pumping: mom requested a manual pump in case need for expression overnight  Interventions Interventions: Breast feeding basics reviewed;Education;LC Services brochure;CDC Guidelines for Breast Pump Cleaning  Discharge Pump: Personal;Manual  (Spectra )  Consult Status Consult Status: Follow-up Date: 02/19/23 Follow-up type: In-patient    Colette LOISE Palin 02/18/2023, 9:56 AM

## 2023-02-18 NOTE — Progress Notes (Signed)
 Post Partum Day 1 Subjective: Has no complaints and tolerating PO. Reports she has voided.   Objective: Blood pressure 123/72, pulse 98, temperature 98.1 F (36.7 C), temperature source Oral, resp. rate 18, height 5' 4 (1.626 m), weight 112.5 kg, last menstrual period 05/06/2022, SpO2 98%, unknown if currently breastfeeding.  Physical Exam:  General: alert and cooperative Lochia: appropriate Uterine Fundus: firm Incision: n/a DVT Evaluation: No evidence of DVT seen on physical exam. Calf/Ankle edema is present.  Recent Labs    02/17/23 1247  HGB 12.9  HCT 39.7    Assessment/Plan: Plan for discharge tomorrow, Breastfeeding, and Contraception declines at this time.    LOS: 1 day   Griselle Rufer K Avleen Bordwell, Student-MidWife 02/18/2023, 11:03 AM

## 2023-02-18 NOTE — Anesthesia Postprocedure Evaluation (Signed)
 Anesthesia Post Note  Patient: Crystal Bradford  Procedure(s) Performed: AN AD HOC LABOR EPIDURAL     Patient location during evaluation: Mother Baby Anesthesia Type: Epidural Level of consciousness: awake and alert and oriented Pain management: satisfactory to patient Vital Signs Assessment: post-procedure vital signs reviewed and stable Respiratory status: respiratory function stable Cardiovascular status: stable Postop Assessment: no headache, no backache, epidural receding, patient able to bend at knees, no signs of nausea or vomiting, adequate PO intake and able to ambulate Anesthetic complications: no   No notable events documented.  Last Vitals:  Vitals:   02/18/23 0134 02/18/23 0534  BP: (!) 125/57 118/69  Pulse: 100 98  Resp: 18 18  Temp: 36.8 C 36.8 C  SpO2: 98% 100%    Last Pain:  Vitals:   02/18/23 0534  TempSrc: Oral  PainSc:    Pain Goal:                   Uliana Brinker

## 2023-02-19 ENCOUNTER — Inpatient Hospital Stay (HOSPITAL_COMMUNITY)
Admission: AD | Admit: 2023-02-19 | Payer: Federal, State, Local not specified - PPO | Source: Home / Self Care | Admitting: Family Medicine

## 2023-02-19 ENCOUNTER — Inpatient Hospital Stay (HOSPITAL_COMMUNITY): Payer: Federal, State, Local not specified - PPO

## 2023-02-19 ENCOUNTER — Encounter: Payer: Federal, State, Local not specified - PPO | Admitting: Family Medicine

## 2023-02-19 LAB — BIRTH TISSUE RECOVERY COLLECTION (PLACENTA DONATION)

## 2023-02-19 MED ORDER — SENNOSIDES-DOCUSATE SODIUM 8.6-50 MG PO TABS: 2.0000 | ORAL_TABLET | Freq: Every day | ORAL | 1 refills | Status: DC

## 2023-02-19 MED ORDER — ACETAMINOPHEN 500 MG PO TABS: 1000.0000 mg | ORAL_TABLET | Freq: Three times a day (TID) | ORAL | 0 refills | Status: DC

## 2023-02-19 MED ORDER — IBUPROFEN 800 MG PO TABS: 800.0000 mg | ORAL_TABLET | Freq: Three times a day (TID) | ORAL | 0 refills | Status: DC

## 2023-02-19 NOTE — Discharge Summary (Signed)
 Postpartum Discharge Summary  Date of Service update 02/19/2023     Patient Name: Crystal Bradford DOB: May 07, 1990 MRN: [id number]  Date of admission: 02/17/2023 Delivery date:02/17/2023 Delivering provider: NICHOLAUS ALMARIE HERO Date of discharge: 02/19/2023  Admitting diagnosis: Normal labor [O80, Z37.9] Encounter for induction of labor [Z34.90] Intrauterine pregnancy: [redacted]w[redacted]d     Secondary diagnosis:  Principal Problem:   NSVD (normal spontaneous vaginal delivery) Active Problems:   Supervision of high risk pregnancy, antepartum   Prediabetes   Obesity affecting pregnancy   PROM (premature rupture of membranes)   Shoulder dystocia during labor and delivery, delivered   LGA (large for gestational age) fetus affecting management of mother  Additional problems: N/A    Discharge diagnosis: Term Pregnancy Delivered                                              Post partum procedures: N/A Augmentation: Pitocin  Complications: None  Hospital course: Onset of Labor With Vaginal Delivery      33 y.o. yo [id number] at [redacted]w[redacted]d was admitted in Latent Labor for PROM on 02/17/2023. Started on pitocin  and made quick progress to complete. 60 second shoulder dystocia complicating delivery. Membrane Rupture Time/Date: 4:30 AM,02/17/2023  Delivery Method:Vaginal, Spontaneous Operative Delivery:N/A Episiotomy: None Lacerations:  None Patient had a postpartum course complicated by none.  She is ambulating, tolerating a regular diet, passing flatus, and urinating well. Patient is discharged home in stable condition on 02/19/23.  Newborn Data: Birth date:02/17/2023 Birth time:10:39 PM Gender:Female Living status:Living Apgars:7 ,9  Weight:4196 g  Magnesium Sulfate received: No BMZ received: No Rhophylac:No MMR:No T-DaP:Given prenatally Flu: No RSV Vaccine received: Yes Transfusion:No  Immunizations received: Immunization History  Administered Date(s) Administered   Influenza,inj,Quad PF,6+ Mos  11/26/2018   PFIZER(Purple Top)SARS-COV-2 Vaccination 07/23/2019, 08/13/2019   Rsv, Bivalent, Protein Subunit Rsvpref,pf (Abrysvo ) 01/29/2023   Tdap 03/18/2019, 01/14/2023    Physical exam  Vitals:   02/18/23 0853 02/18/23 1228 02/18/23 2058 02/19/23 0630  BP: 123/72 129/65 125/66 (!) 127/57  Pulse: 98 99 98 97  Resp: 18 16 18 18   Temp: 98.1 F (36.7 C) 98.4 F (36.9 C) 98 F (36.7 C) 98.3 F (36.8 C)  TempSrc: Oral Oral Oral Oral  SpO2: 98% 96% 94% 97%  Weight:      Height:       General: alert, cooperative, and no distress Lochia: appropriate Uterine Fundus: firm Incision: N/A DVT Evaluation: No evidence of DVT seen on physical exam. Labs: Lab Results  Component Value Date   WBC 10.5 02/17/2023   HGB 12.9 02/17/2023   HCT 39.7 02/17/2023   MCV 86.3 02/17/2023   PLT 245 02/17/2023      Latest Ref Rng & Units 02/17/2023   12:47 PM  CMP  Glucose 70 - 99 mg/dL 85   BUN 6 - 20 mg/dL 9   Creatinine 9.55 - 8.99 mg/dL 9.38   Sodium 864 - 854 mmol/L 134   Potassium 3.5 - 5.1 mmol/L 4.0   Chloride 98 - 111 mmol/L 105   CO2 22 - 32 mmol/L 18   Calcium 8.9 - 10.3 mg/dL 8.9   Total Protein 6.5 - 8.1 g/dL 6.5   Total Bilirubin 0.0 - 1.2 mg/dL 0.5   Alkaline Phos 38 - 126 U/L 150   AST 15 - 41 U/L 16   ALT 0 -  44 U/L 14    Edinburgh Score:    02/18/2023    7:15 AM  Edinburgh Postnatal Depression Scale Screening Tool  I have been able to laugh and see the funny side of things. 0  I have looked forward with enjoyment to things. 0  I have blamed myself unnecessarily when things went wrong. 0  I have been anxious or worried for no good reason. 0  I have felt scared or panicky for no good reason. 0  Things have been getting on top of me. 0  I have been so unhappy that I have had difficulty sleeping. 0  I have felt sad or miserable. 0  I have been so unhappy that I have been crying. 0  The thought of harming myself has occurred to me. 0  Edinburgh Postnatal Depression  Scale Total 0   Edinburgh Postnatal Depression Scale Total: 0   After visit meds:  Allergies as of 02/19/2023       Reactions   Penicillin G Rash   Amoxicillin Hives, Swelling        Medication List     TAKE these medications    Abrysvo  120 MCG/0.5ML injection Generic drug: RSV bivalent vaccine Inject into the muscle.   acetaminophen  500 MG tablet Commonly known as: TYLENOL  Take 2 tablets (1,000 mg total) by mouth every 8 (eight) hours.   aspirin  EC 81 MG tablet Take 1 tablet (81 mg total) by mouth daily. Take after 12 weeks for prevention of preeclampsia later in pregnancy   cetirizine 10 MG tablet Commonly known as: ZYRTEC Take 10 mg by mouth daily.   docusate sodium  100 MG capsule Commonly known as: COLACE Take 100 mg by mouth 2 (two) times daily.   ibuprofen  800 MG tablet Commonly known as: ADVIL  Take 1 tablet (800 mg total) by mouth every 8 (eight) hours.   omeprazole 20 MG capsule Commonly known as: PRILOSEC Take 20 mg by mouth daily.   PRENATAL VITAMINS PO Take by mouth.   senna-docusate 8.6-50 MG tablet Commonly known as: Senokot-S Take 2 tablets by mouth daily. Start taking on: February 20, 2023         Discharge home in stable condition Infant Feeding: Breast Infant Disposition:home with mother Discharge instruction: per After Visit Summary and Postpartum booklet. Activity: Advance as tolerated. Pelvic rest for 6 weeks.  Diet: routine diet Future Appointments: Future Appointments  Date Time Provider Department Center  04/09/2023  9:35 AM Stinson, Jacob J, DO [facility] None   Follow up Visit:  Message sent to [facility] 02/17/23  Please schedule this patient for a In person postpartum visit in 6 weeks with the following provider: Any provider. Additional Postpartum F/U: none   High risk pregnancy complicated by:  LGA, SD Delivery mode:  Vaginal, Spontaneous Anticipated Birth Control:  Condoms and NFP   02/19/2023 Caidin Heidenreich K Arif Amendola,  Student-MidWife

## 2023-02-19 NOTE — Lactation Note (Signed)
 This note was copied from a baby's chart. Lactation Consultation Note  Patient Name: Crystal Bradford Date: 02/19/2023 Age:32 hours Reason for consult: Follow-up assessment;Maternal discharge;Early term 37-38.6wks  P2, 38 wks, @ 33 hrs of life. Infant feeding well on left breast with LC arrival. Mom shares baby doing well, just need to flare out lips sometimes. Highlighted good aspects of latch to watch for and nipple inspection post feed. Encouraged mom 2nd pregnancy stimulates breasts/ milk production much easier/faster then first. Baby weight loss expected to be 7-10 % by go home day, assured baby right where they are suppose to be and will catch up quickly with milk coming in. Encouraged starting with hand expression and breast compression if baby inpatient @ breast. Encouraged mom to keep working on big mouth latch with baby and use EBM or coconut oil after each feed. Discussed cluster feeding overnight/ early morning brings in our milk supply, shared expectations of milk coming in. Highlighted risk of engorgement. Discussed hand pump/express to soften breasts, motrin  as anti-inflammatory, and ice packs for 10-20 minutes post feed/pumping if still over-full is the best treatments for inflamed/engorged breasts. Discussed hand pump working to empty breast when baby or electric pump struggle to soften over-full breast. Re-enforced LC OP services and milk storage.  Maternal Data Has patient been taught Hand Expression?: Yes Does the patient have breastfeeding experience prior to this delivery?: Yes How long did the patient breastfeed?: 8 months between feeding and pumping  Feeding Mother's Current Feeding Choice: Breast Milk  LATCH Score Latch: Grasps breast easily, tongue down, lips flanged, rhythmical sucking.  Audible Swallowing: Spontaneous and intermittent  Type of Nipple: Everted at rest and after stimulation  Comfort (Breast/Nipple): Soft / non-tender  Hold (Positioning):  No assistance needed to correctly position infant at breast.  LATCH Score: 10   Lactation Tools Discussed/Used    Interventions Interventions: Breast feeding basics reviewed;Hand express;Breast compression;Expressed milk;Hand pump;Education;LC Services brochure (Milk Storage Guidelines)  Discharge Discharge Education: Engorgement and breast care Pump: Personal;Manual (Per mom has a pump @ home, and hand pump provided by staff on previous shift)  Consult Status Consult Status: Complete Date: 02/19/23    Heinz Muskrat 02/19/2023, 8:20 AM

## 2023-02-25 ENCOUNTER — Telehealth (HOSPITAL_COMMUNITY): Payer: Self-pay | Admitting: *Deleted

## 2023-02-25 NOTE — Telephone Encounter (Signed)
02/25/2023  Name: AUTUM BENFER MRN: [id number] DOB: 05-Feb-1990  Reason for Call:  Transition of Care Hospital Discharge Call  Contact Status: Patient Contact Status: Message  Language assistant needed:          Follow-Up Questions:    Inocente Salles Postnatal Depression Scale:  In the Past 7 Days:    PHQ2-9 Depression Scale:     Discharge Follow-up:    Post-discharge interventions: NA  Salena Saner, RN 02/25/2023 15:54

## 2023-02-26 ENCOUNTER — Encounter: Payer: Federal, State, Local not specified - PPO | Admitting: Family Medicine

## 2023-04-09 ENCOUNTER — Ambulatory Visit: Payer: Federal, State, Local not specified - PPO | Admitting: Family Medicine

## 2023-04-09 DIAGNOSIS — Z1339 Encounter for screening examination for other mental health and behavioral disorders: Secondary | ICD-10-CM

## 2023-04-09 NOTE — Progress Notes (Signed)
 Post Partum Visit Note  Crystal Bradford is a 33 y.o. G58P2002 female who presents for a postpartum visit. She is 7 weeks postpartum following a normal spontaneous vaginal delivery.  I have fully reviewed the prenatal and intrapartum course. The delivery was at 38 gestational weeks.  Anesthesia: epidural. Postpartum course has been normal. Baby is doing well. Baby is feeding by bottle - [employer] . Bleeding no bleeding. Bowel function is normal. Bladder function is normal. Patient is not sexually active. Contraception method is none. Postpartum depression screening: negative.   The pregnancy intention screening data noted above was reviewed. Potential methods of contraception were discussed. The patient elected to proceed with No data recorded.   Edinburgh Postnatal Depression Scale - 04/09/23 0948       Edinburgh Postnatal Depression Scale:  In the Past 7 Days   I have been able to laugh and see the funny side of things. 0    I have looked forward with enjoyment to things. 0    I have blamed myself unnecessarily when things went wrong. 0    I have been anxious or worried for no good reason. 0    I have felt scared or panicky for no good reason. 0    Things have been getting on top of me. 1    I have been so unhappy that I have had difficulty sleeping. 0    I have felt sad or miserable. 0    I have been so unhappy that I have been crying. 0    The thought of harming myself has occurred to me. 0    Edinburgh Postnatal Depression Scale Total 1             Health Maintenance Due  Topic Date Due   INFLUENZA VACCINE  08/15/2022   COVID-19 Vaccine (3 - 2024-25 season) 09/15/2022    The following portions of the patient's history were reviewed and updated as appropriate: allergies, current medications, past family history, past medical history, past social history, past surgical history, and problem list.  Review of Systems Pertinent items are noted in HPI.  Objective:  BP  (!) 117/55 (BP Location: Right Arm, Patient Position: Sitting, Cuff Size: Large)   Pulse 79   Wt 230 lb (104.3 kg)   LMP 05/06/2022   Breastfeeding No   BMI 39.48 kg/m    General:  alert, cooperative, and no distress   Breasts:  not indicated  Lungs: clear to auscultation bilaterally  Heart:  regular rate and rhythm, S1, S2 normal, no murmur, click, rub or gallop  Abdomen: soft, non-tender; bowel sounds normal; no masses,  no organomegaly   Wound N/a  GU exam:  not indicated       Assessment:   1. Postpartum care and examination (Primary)   Plan:   Essential components of care per ACOG recommendations:  1.  Mood and well being: Patient with negative depression screening today. Reviewed local resources for support.  - Patient tobacco use? No.   - hx of drug use? No.    2. Infant care and feeding:  -Patient currently breastmilk feeding? No.  -Social determinants of health (SDOH) reviewed in EPIC. No concerns  3. Sexuality, contraception and birth spacing - Patient does not want a pregnancy in the next year.   - Reviewed reproductive life planning. Reviewed contraceptive methods based on pt preferences and effectiveness.  Patient desired Female Condom today.   - Discussed birth spacing of 24  months  4. Sleep and fatigue -Encouraged family/partner/community support of 4 hrs of uninterrupted sleep to help with mood and fatigue  5. Physical Recovery  - Discussed patients delivery and complications. She describes her labor as good. - Patient had a Vaginal, no problems at delivery.  Perineal healing reviewed. Patient expressed understanding - Patient has urinary incontinence? No. - Patient is safe to resume physical and sexual activity  6.  Health Maintenance - HM due items addressed Yes - Last pap smear  Diagnosis  Date Value Ref Range Status  08/22/2022   Final   - Negative for intraepithelial lesion or malignancy (NILM)   Pap smear not done at today's visit.  -Breast  Cancer screening indicated? No.   7. Chronic Disease/Pregnancy Condition follow up: None  - PCP follow up  Levie Heritage, DO [facility], [facility]
